# Patient Record
Sex: Male | Born: 1990
Health system: Southern US, Community
[De-identification: ages and names within clinical notes are randomized; demographics above are authoritative.]

## PROBLEM LIST (undated history)

## (undated) ENCOUNTER — Ambulatory Visit (HOSPITAL_COMMUNITY): Admission: EM | Payer: Medicaid Other

## (undated) ENCOUNTER — Emergency Department (HOSPITAL_COMMUNITY): Payer: Medicaid Other | Source: Home / Self Care

## (undated) DIAGNOSIS — T1491XA Suicide attempt, initial encounter: Secondary | ICD-10-CM

## (undated) DIAGNOSIS — F319 Bipolar disorder, unspecified: Secondary | ICD-10-CM

## (undated) DIAGNOSIS — G43909 Migraine, unspecified, not intractable, without status migrainosus: Secondary | ICD-10-CM

## (undated) DIAGNOSIS — F209 Schizophrenia, unspecified: Secondary | ICD-10-CM

## (undated) DIAGNOSIS — G40909 Epilepsy, unspecified, not intractable, without status epilepticus: Secondary | ICD-10-CM

## (undated) DIAGNOSIS — F32A Depression, unspecified: Secondary | ICD-10-CM

## (undated) DIAGNOSIS — F329 Major depressive disorder, single episode, unspecified: Secondary | ICD-10-CM

## (undated) DIAGNOSIS — F419 Anxiety disorder, unspecified: Secondary | ICD-10-CM

## (undated) DIAGNOSIS — K219 Gastro-esophageal reflux disease without esophagitis: Secondary | ICD-10-CM

## (undated) DIAGNOSIS — E559 Vitamin D deficiency, unspecified: Secondary | ICD-10-CM

## (undated) DIAGNOSIS — F1210 Cannabis abuse, uncomplicated: Secondary | ICD-10-CM

## (undated) DIAGNOSIS — F909 Attention-deficit hyperactivity disorder, unspecified type: Secondary | ICD-10-CM

## (undated) DIAGNOSIS — R7303 Prediabetes: Secondary | ICD-10-CM

## (undated) HISTORY — PX: WISDOM TOOTH EXTRACTION: SHX21

## (undated) HISTORY — PX: OTHER SURGICAL HISTORY: SHX169

## (undated) HISTORY — DX: Cannabis abuse, uncomplicated: F12.10

---
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
## 2010-03-06 ENCOUNTER — Emergency Department (HOSPITAL_COMMUNITY): Admission: EM | Admit: 2010-03-06 | Discharge: 2010-03-07 | Payer: Self-pay | Admitting: Emergency Medicine

## 2010-07-07 ENCOUNTER — Ambulatory Visit: Payer: Self-pay | Admitting: Family Medicine

## 2010-08-08 ENCOUNTER — Ambulatory Visit: Payer: Self-pay | Admitting: Family Medicine

## 2010-12-30 ENCOUNTER — Encounter: Payer: Self-pay | Admitting: *Deleted

## 2011-03-05 ENCOUNTER — Ambulatory Visit (INDEPENDENT_AMBULATORY_CARE_PROVIDER_SITE_OTHER): Payer: Medicaid Other

## 2011-03-05 ENCOUNTER — Inpatient Hospital Stay (INDEPENDENT_AMBULATORY_CARE_PROVIDER_SITE_OTHER)
Admission: RE | Admit: 2011-03-05 | Discharge: 2011-03-05 | Disposition: A | Discharge status: Home / Self Care | Payer: Medicaid Other | Source: Ambulatory Visit | Attending: Family Medicine | Admitting: Family Medicine

## 2011-03-05 DIAGNOSIS — S60229A Contusion of unspecified hand, initial encounter: Secondary | ICD-10-CM

## 2011-04-07 NOTE — Op Note (Signed)
[name], [name]               ACCOUNT NO.:  [id number]   MEDICAL RECORD NO.:  [id number]            PATIENT TYPE:   LOCATION:                                 FACILITY:   PHYSICIAN:  Valetta Fuller, M.D.  DATE OF BIRTH:  21-Dec-1990   DATE OF PROCEDURE:  01/09/2006  DATE OF DISCHARGE:                                 OPERATIVE REPORT   PREOPERATIVE DIAGNOSIS:  1.  Meatal stenosis.  2.  Dysuria.   POSTOPERATIVE DIAGNOSIS:  1.  Meatal stenosis.  2.  Dysuria.   PROCEDURE PERFORMED:  Meatal dilation with meatotomy.   SURGEON:  Valetta Fuller, M.D.   ANESTHESIA:  General mask.   INDICATIONS:  Garrett Carrillo is a 20 year old male who presented with some  intermittent, but otherwise fairly chronic dysuria. Clinical exam revealed a  moderate-to-severe meatal stenosis. Urinalysis was unremarkable. We felt  that there was a high likelihood that his meatal stenosis was causing his  dysuria. We recommended consideration for meatotomy; and that was accepted  by the patient's family. They appear to understand the advantages and  disadvantages; and a full informed consent was obtained.   TECHNIQUE AND FINDINGS:  The patient was brought to the operating room where  he had successful induction of general mask anesthesia. He was prepped and  draped in the usual manner. The meatus was gently dilated to 14-French. A  meatotomy was then done in a standard manner utilizing a straight hemostat  at the six o'clock position. An incision was then made. The mucosal edges  did not appear to retract; and, therefore, suturing was not required. At the  completion of procedure, the meatus easily accepted an 18-French dilator.  Lidocaine jelly was instilled. The patient appeared to tolerate the  procedure well.  There were no obvious complications.           ______________________________  Valetta Fuller, M.D.  Electronically Signed     DSG/MEDQ  D:  01/08/2006  T:  01/09/2006  Job:  [id number]

## 2011-04-18 ENCOUNTER — Ambulatory Visit (INDEPENDENT_AMBULATORY_CARE_PROVIDER_SITE_OTHER): Payer: Medicaid Other

## 2011-04-18 ENCOUNTER — Inpatient Hospital Stay (INDEPENDENT_AMBULATORY_CARE_PROVIDER_SITE_OTHER)
Admission: RE | Admit: 2011-04-18 | Discharge: 2011-04-18 | Disposition: A | Discharge status: Home / Self Care | Payer: Medicaid Other | Source: Ambulatory Visit | Attending: Emergency Medicine | Admitting: Emergency Medicine

## 2011-04-18 DIAGNOSIS — IMO0002 Reserved for concepts with insufficient information to code with codable children: Secondary | ICD-10-CM

## 2011-04-18 DIAGNOSIS — H60399 Other infective otitis externa, unspecified ear: Secondary | ICD-10-CM

## 2011-07-18 ENCOUNTER — Encounter: Payer: Self-pay | Admitting: Family Medicine

## 2011-12-17 ENCOUNTER — Encounter (HOSPITAL_COMMUNITY): Payer: Self-pay | Admitting: *Deleted

## 2011-12-17 ENCOUNTER — Emergency Department (INDEPENDENT_AMBULATORY_CARE_PROVIDER_SITE_OTHER): Payer: Medicaid Other

## 2011-12-17 ENCOUNTER — Emergency Department (INDEPENDENT_AMBULATORY_CARE_PROVIDER_SITE_OTHER)
Admission: EM | Admit: 2011-12-17 | Discharge: 2011-12-17 | Disposition: A | Discharge status: Home / Self Care | Payer: Medicaid Other | Source: Home / Self Care | Attending: Emergency Medicine | Admitting: Emergency Medicine

## 2011-12-17 DIAGNOSIS — S60219A Contusion of unspecified wrist, initial encounter: Secondary | ICD-10-CM

## 2011-12-17 DIAGNOSIS — S60211A Contusion of right wrist, initial encounter: Secondary | ICD-10-CM

## 2011-12-17 HISTORY — DX: Attention-deficit hyperactivity disorder, unspecified type: F90.9

## 2011-12-17 MED ORDER — ACETAMINOPHEN-CODEINE #3 300-30 MG PO TABS: 1.0000 | ORAL_TABLET | ORAL | Status: AC | PRN

## 2011-12-17 NOTE — Progress Notes (Signed)
Orthopedic Tech Progress Note Patient Details:  Garrett Carrillo 1991/04/14 [id number]  Type of Splint: Thumb spica Splint Location: right hand Splint Interventions: Application    Nikki Dom 12/17/2011, 7:46 PM

## 2011-12-17 NOTE — ED Notes (Signed)
Per pt slipped on wet floor fell tried to catch self with right hand onset Friday now with pain and swelling right anterior hand

## 2011-12-17 NOTE — ED Provider Notes (Signed)
Chief Complaint  Patient presents with  . Hand Pain  . Hand Injury    History of Present Illness:  The patient fell at school Friday, 3 days ago. He slipped on some grease on some steps and fell on his outstretched right hand. Ever since then he's had pain in the wrist which extends up into the first carpal.  Review of Systems:  Other than noted above, the patient denies any of the following symptoms: Systemic:  No fevers, chills, sweats, or aches.  No fatigue or tiredness. Musculoskeletal:  No joint pain, arthritis, bursitis, swelling, back pain, or neck pain. Neurological:  No muscular weakness, paresthesias, headache, or trouble with speech or coordination.  No dizziness.   PMFSH:  Past medical history, family history, social history, meds, and allergies were reviewed.  Physical Exam:   Vital signs:  BP 120/59  Pulse 61  Temp(Src) 98.8 F (37.1 C) (Oral)  Resp 20  SpO2 100% Gen:  Alert and oriented times 3.  In no distress. Musculoskeletal: No swelling, bruising, or deformity. There is pain to palpation over the wrist, over the anatomical snuffbox area, and over the first metacarpal. All joints have a full range of motion but with pain on movement of the wrist and the thumb Otherwise, all joints had a full a ROM with no swelling, bruising or deformity.  No edema, pulses full. Extremities were warm and pink.  Capillary refill was brisk.  Skin:  Clear, warm and dry.  No rash. Neuro:  Alert and oriented times 3.  Muscle strength was normal.  Sensation was intact to light touch.   Labs:  No results found for this or any previous visit.   Radiology:  Dg Wrist Complete Right  12/17/2011  *RADIOLOGY REPORT*  Clinical Data: Fall on outstretched hand.  RIGHT WRIST - COMPLETE 3+ VIEW  Comparison: 03/05/2011  Findings: No fracture, foreign body, or acute bony findings are identified.  IMPRESSION:  No significant abnormality identified.  Original Report Authenticated By: Dellia Cloud,  M.D.   Dg Hand Complete Right  12/17/2011  *RADIOLOGY REPORT*  Clinical Data: Right hand and wrist pain post fall  RIGHT HAND - COMPLETE 3+ VIEW  Comparison: 03/05/2011  Findings: Bone mineralization normal. Joint spaces preserved. No fracture, dislocation, or bone destruction.  IMPRESSION: No acute abnormalities.  Original Report Authenticated By: Lollie Marrow, M.D.    Medications given in U[facility]:  None  Assessment:   Diagnoses that have been ruled out:  None  Diagnoses that are still under consideration:  None  Final diagnoses:  Contusion of right wrist    Plan:   1.  The following meds were prescribed:   New Prescriptions   ACETAMINOPHEN-CODEINE (TYLENOL #3) 300-30 MG PER TABLET    Take 1-2 tablets by mouth every 4 (four) hours as needed for pain.   2.  The patient was instructed in symptomatic care, including rest and activity, elevation, application of ice and compression.  Appropriate handouts were given. 3.  The patient was told to return if becoming worse in any way, if no better in 3 or 4 days, and given some red flag symptoms that would indicate earlier return.   4.  The patient was told to follow up with Dr. Melvyn Novas extra week. He was placed in a thumb spica splint.   Roque Lias, MD 12/17/11 [phone]

## 2012-08-07 ENCOUNTER — Encounter (HOSPITAL_COMMUNITY): Payer: Self-pay | Admitting: *Deleted

## 2012-08-07 ENCOUNTER — Emergency Department (HOSPITAL_COMMUNITY): Payer: Medicaid Other

## 2012-08-07 ENCOUNTER — Emergency Department (HOSPITAL_COMMUNITY)
Admission: EM | Admit: 2012-08-07 | Discharge: 2012-08-07 | Disposition: A | Discharge status: Home / Self Care | Payer: Medicaid Other | Attending: Emergency Medicine | Admitting: Emergency Medicine

## 2012-08-07 DIAGNOSIS — Y9239 Other specified sports and athletic area as the place of occurrence of the external cause: Secondary | ICD-10-CM | POA: Insufficient documentation

## 2012-08-07 DIAGNOSIS — X500XXA Overexertion from strenuous movement or load, initial encounter: Secondary | ICD-10-CM | POA: Insufficient documentation

## 2012-08-07 DIAGNOSIS — F17200 Nicotine dependence, unspecified, uncomplicated: Secondary | ICD-10-CM | POA: Insufficient documentation

## 2012-08-07 DIAGNOSIS — Z882 Allergy status to sulfonamides status: Secondary | ICD-10-CM | POA: Insufficient documentation

## 2012-08-07 DIAGNOSIS — Y92838 Other recreation area as the place of occurrence of the external cause: Secondary | ICD-10-CM | POA: Insufficient documentation

## 2012-08-07 DIAGNOSIS — Z880 Allergy status to penicillin: Secondary | ICD-10-CM | POA: Insufficient documentation

## 2012-08-07 DIAGNOSIS — M25569 Pain in unspecified knee: Secondary | ICD-10-CM | POA: Insufficient documentation

## 2012-08-07 DIAGNOSIS — S8990XA Unspecified injury of unspecified lower leg, initial encounter: Secondary | ICD-10-CM

## 2012-08-07 MED ORDER — IBUPROFEN 800 MG PO TABS: 800.0000 mg | ORAL_TABLET | Freq: Three times a day (TID) | ORAL | Status: DC

## 2012-08-07 MED ORDER — IBUPROFEN 800 MG PO TABS
800.0000 mg | ORAL_TABLET | Freq: Once | ORAL | Status: AC
  Administered 2012-08-07: 800 mg via ORAL
  Filled 2012-08-07: qty 1

## 2012-08-07 NOTE — ED Notes (Signed)
Pt was in gym class doing an exercise when he heard his right knee pop. Pt c/o pain with weight bearing. No obvious deformity noted. Pt arrived by EMS.

## 2012-08-07 NOTE — ED Provider Notes (Signed)
History     CSN: [id number]  Arrival date & time 08/07/12  1134   None     Chief Complaint  Patient presents with  . Knee Pain    (Consider location/radiation/quality/duration/timing/severity/associated sxs/prior treatment) Patient is a 21 y.o. male presenting with knee pain. The history is provided by the patient ( police).  Knee Pain This is a new problem. The current episode started today.  21yo male with L knee pain after doing a lundge squat at the [facility] today.  Unable to bear weight.  No deformity noted.  Good CMS to extremity.    Past Medical History  Diagnosis Date  . ADHD (attention deficit hyperactivity disorder)     Past Surgical History  Procedure Date  . Thumb surgery     Family History  Problem Relation Age of Onset  . Hypertension Mother   . Hypertension Maternal Grandmother   . Hypertension Maternal Grandfather   . Hypertension Paternal Grandmother   . Hypertension Paternal Grandfather     History  Substance Use Topics  . Smoking status: Current Every Day Smoker    Types: Cigarettes  . Smokeless tobacco: Not on file  . Alcohol Use: Yes      Review of Systems  Constitutional: Negative.   HENT: Negative.   Eyes: Negative.   Respiratory: Negative.   Cardiovascular: Negative.   Gastrointestinal: Negative.   Musculoskeletal:       L knee pain  Neurological: Negative.   Psychiatric/Behavioral: Negative.   All other systems reviewed and are negative.    Allergies  Penicillins and Sulfur  Home Medications  No current outpatient prescriptions on file.  BP 109/60  Pulse 80  Temp 98.3 F (36.8 C) (Oral)  Resp 18  SpO2 98%  Physical Exam  Nursing note and vitals reviewed. Constitutional: He is oriented to person, place, and time. He appears well-developed and well-nourished.  HENT:  Head: Normocephalic.  Eyes: Conjunctivae normal and EOM are normal. Pupils are equal, round, and reactive to light.  Neck: Normal range of motion. Neck  supple.  Cardiovascular: Normal rate.   Pulmonary/Chest: Effort normal.  Abdominal: Soft.  Musculoskeletal: Normal range of motion.       L knee tenderness with external rotation of L foot.  +cms to extremity.    Neurological: He is alert and oriented to person, place, and time.  Skin: Skin is warm and dry.  Psychiatric: He has a normal mood and affect.    ED Course  Procedures (including critical care time)  Labs Reviewed - No data to display Dg Knee Complete 4 Views Right  08/07/2012  *RADIOLOGY REPORT*  Clinical Data: Pain and swelling  RIGHT KNEE - COMPLETE 4+ VIEW  Comparison: None.  Findings: Frontal, lateral, and bilateral oblique views were obtained.  No fracture, dislocation, or effusion.  Joint spaces appear intact.  No erosive change.  IMPRESSION: No abnormality noted.   Original Report Authenticated By: Arvin Collard. WOODRUFF III, M.D.      No diagnosis found.    MDM  Left knee pain after doing a longitudinal bile today. Immobilizer and crutches. Followup with or said. Ice and elevate x24 hours. Ibuprofen for pain.        Remi Haggard, NP 08/07/12 1340

## 2012-08-07 NOTE — ED Notes (Signed)
Patient transported to X-ray 

## 2012-08-10 NOTE — ED Provider Notes (Signed)
Medical screening examination/treatment/procedure(s) were performed by non-physician practitioner and as supervising physician I was immediately available for consultation/collaboration.  Flint Melter, MD 08/10/12 1316

## 2013-06-20 ENCOUNTER — Encounter (HOSPITAL_COMMUNITY): Payer: Self-pay | Admitting: Emergency Medicine

## 2013-06-20 ENCOUNTER — Emergency Department (HOSPITAL_COMMUNITY)
Admission: EM | Admit: 2013-06-20 | Discharge: 2013-06-20 | Disposition: A | Discharge status: Home / Self Care | Payer: Medicaid Other | Attending: Emergency Medicine | Admitting: Emergency Medicine

## 2013-06-20 ENCOUNTER — Emergency Department (HOSPITAL_COMMUNITY): Payer: Medicaid Other

## 2013-06-20 DIAGNOSIS — Y929 Unspecified place or not applicable: Secondary | ICD-10-CM | POA: Insufficient documentation

## 2013-06-20 DIAGNOSIS — S93402A Sprain of unspecified ligament of left ankle, initial encounter: Secondary | ICD-10-CM

## 2013-06-20 DIAGNOSIS — X500XXA Overexertion from strenuous movement or load, initial encounter: Secondary | ICD-10-CM | POA: Insufficient documentation

## 2013-06-20 DIAGNOSIS — M25473 Effusion, unspecified ankle: Secondary | ICD-10-CM | POA: Insufficient documentation

## 2013-06-20 DIAGNOSIS — M25476 Effusion, unspecified foot: Secondary | ICD-10-CM | POA: Insufficient documentation

## 2013-06-20 DIAGNOSIS — Z880 Allergy status to penicillin: Secondary | ICD-10-CM | POA: Insufficient documentation

## 2013-06-20 DIAGNOSIS — IMO0002 Reserved for concepts with insufficient information to code with codable children: Secondary | ICD-10-CM | POA: Insufficient documentation

## 2013-06-20 DIAGNOSIS — Y939 Activity, unspecified: Secondary | ICD-10-CM | POA: Insufficient documentation

## 2013-06-20 DIAGNOSIS — Z8659 Personal history of other mental and behavioral disorders: Secondary | ICD-10-CM | POA: Insufficient documentation

## 2013-06-20 DIAGNOSIS — F17200 Nicotine dependence, unspecified, uncomplicated: Secondary | ICD-10-CM | POA: Insufficient documentation

## 2013-06-20 MED ORDER — NAPROXEN 500 MG PO TABS: 500.0000 mg | ORAL_TABLET | Freq: Two times a day (BID) | ORAL | Status: DC

## 2013-06-20 NOTE — ED Provider Notes (Signed)
Medical screening examination/treatment/procedure(s) were performed by non-physician practitioner and as supervising physician I was immediately available for consultation/collaboration.   Jamieon Lannen M Marcie Shearon, MD 06/20/13 1837 

## 2013-06-20 NOTE — ED Provider Notes (Signed)
  CSN: [id number]     Arrival date & time 06/20/13  1012 History     First MD Initiated Contact with Patient 06/20/13 1016     Chief Complaint  Patient presents with  . Ankle Pain   (Consider location/radiation/quality/duration/timing/severity/associated sxs/prior Treatment) HPI Comments: Patient presenting with left ankle pain.  He reports that yesterday he slipped and twisted his left ankle.  He has been having pain and swelling of the left lateral malleolus since that time.  Patient has been applying ice to the area, but does not feel that it has helped.  He has not taken anything for pain.  He has been ambulatory since the injury, but reports that he has increased pain with bearing weight.    Patient is a 22 y.o. male presenting with ankle pain. The history is provided by the patient.  Ankle Pain Prior injury to area:  No Associated symptoms: no fever, no numbness and no tingling     Past Medical History  Diagnosis Date  . ADHD (attention deficit hyperactivity disorder)    Past Surgical History  Procedure Laterality Date  . Thumb surgery     Family History  Problem Relation Age of Onset  . Hypertension Mother   . Hypertension Maternal Grandmother   . Hypertension Maternal Grandfather   . Hypertension Paternal Grandmother   . Hypertension Paternal Grandfather    History  Substance Use Topics  . Smoking status: Current Every Day Smoker -- 1.00 packs/day    Types: Cigarettes  . Smokeless tobacco: Not on file  . Alcohol Use: Yes     Comment: a bottle of liquor every weekend    Review of Systems  Constitutional: Negative for fever.  Musculoskeletal:       Left ankle pain    Allergies  Penicillins and Sulfur  Home Medications  No current outpatient prescriptions on file. BP 126/66  Pulse 86  Temp(Src) 98.4 F (36.9 C) (Oral)  Resp 16  Ht 6\' 2"  (1.88 m)  Wt 185 lb (83.915 kg)  BMI 23.74 kg/m2  SpO2 100% Physical Exam  Nursing note and vitals  reviewed. Constitutional: He appears well-developed and well-nourished. No distress.  HENT:  Head: Normocephalic and atraumatic.  Cardiovascular: Normal rate, regular rhythm and normal heart sounds.   Pulmonary/Chest: Effort normal and breath sounds normal.  Musculoskeletal:       Left ankle: He exhibits decreased range of motion and swelling. He exhibits no ecchymosis, no deformity and normal pulse. Tenderness. Lateral malleolus tenderness found.  Neurological: He is alert. No sensory deficit.  Skin: Skin is warm and dry. He is not diaphoretic. No erythema.  Psychiatric: He has a normal mood and affect.    ED Course   Procedures (including critical care time)  Labs Reviewed - No data to display Dg Ankle Complete Left  06/20/2013   *RADIOLOGY REPORT*  Clinical Data: Ankle pain and swelling  LEFT ANKLE COMPLETE - 3+ VIEW  Comparison: None.  Findings: Three-view exam left ankle shows no fracture.  No subluxation or dislocation.  Ankle mortise is preserved.  No worrisome lytic or sclerotic osseous abnormality.  IMPRESSION: No acute bony findings.   Original Report Authenticated By: Kennith Center, M.D.   No diagnosis found.  MDM  Patient presenting with left ankle pain after twisting his left ankle yesterday.  Xray negative.  Patient is neurovascularly intact.  Patient given Ankle ASO and crutches.  Pascal Lux [place], PA-C 06/20/13 1136

## 2013-06-20 NOTE — Progress Notes (Signed)
Orthopedic Tech Progress Note Patient Details:  Garrett Carrillo [birth year] [id number]  Ortho Devices Type of Ortho Device: Crutches;ASO Ortho Device/Splint Interventions: Application   Cammer, Mickie Bail 06/20/2013, 12:46 PM

## 2013-06-20 NOTE — ED Notes (Signed)
Patient states he slipped on a step at work and heard something "pop".  Patient presents with swollen L ankle and 10/10 pain.

## 2014-01-25 ENCOUNTER — Emergency Department (HOSPITAL_COMMUNITY)
Admission: EM | Admit: 2014-01-25 | Discharge: 2014-01-25 | Disposition: A | Discharge status: Home / Self Care | Payer: Medicaid Other | Source: Home / Self Care | Attending: Emergency Medicine | Admitting: Emergency Medicine

## 2014-01-25 ENCOUNTER — Emergency Department (INDEPENDENT_AMBULATORY_CARE_PROVIDER_SITE_OTHER): Payer: Medicaid Other

## 2014-01-25 ENCOUNTER — Encounter (HOSPITAL_COMMUNITY): Payer: Self-pay | Admitting: Emergency Medicine

## 2014-01-25 DIAGNOSIS — R229 Localized swelling, mass and lump, unspecified: Secondary | ICD-10-CM

## 2014-01-25 MED ORDER — NAPROXEN 500 MG PO TABS: 500.0000 mg | ORAL_TABLET | Freq: Two times a day (BID) | ORAL | Status: DC

## 2014-01-25 NOTE — Discharge Instructions (Signed)

## 2014-01-25 NOTE — ED Provider Notes (Signed)
Medical screening examination/treatment/procedure(s) were performed by non-physician practitioner and as supervising physician I was immediately available for consultation/collaboration.  Monet North, M.D.  Nikolai Wilczak C Tandrea Kommer, MD 01/25/14 2111 
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
## 2014-03-11 ENCOUNTER — Encounter: Payer: Self-pay | Admitting: Medical

## 2014-03-18 ENCOUNTER — Emergency Department (INDEPENDENT_AMBULATORY_CARE_PROVIDER_SITE_OTHER): Payer: Medicaid Other

## 2014-03-18 ENCOUNTER — Emergency Department (INDEPENDENT_AMBULATORY_CARE_PROVIDER_SITE_OTHER)
Admission: EM | Admit: 2014-03-18 | Discharge: 2014-03-18 | Disposition: A | Discharge status: Home / Self Care | Payer: Medicaid Other | Source: Home / Self Care | Attending: Family Medicine | Admitting: Family Medicine

## 2014-03-18 ENCOUNTER — Encounter (HOSPITAL_COMMUNITY): Payer: Self-pay | Admitting: Emergency Medicine

## 2014-03-18 DIAGNOSIS — S60229A Contusion of unspecified hand, initial encounter: Secondary | ICD-10-CM

## 2014-03-18 DIAGNOSIS — S60221A Contusion of right hand, initial encounter: Secondary | ICD-10-CM | POA: Diagnosis present

## 2014-03-18 MED ORDER — TRAMADOL HCL 50 MG PO TABS: 50.0000 mg | ORAL_TABLET | Freq: Four times a day (QID) | ORAL | Status: DC | PRN

## 2014-03-18 MED ORDER — HYDROCODONE-ACETAMINOPHEN 5-325 MG PO TABS
ORAL_TABLET | ORAL | Status: AC
  Filled 2014-03-18: qty 2

## 2014-03-18 MED ORDER — IBUPROFEN 600 MG PO TABS: 600.0000 mg | ORAL_TABLET | Freq: Four times a day (QID) | ORAL | Status: DC | PRN

## 2014-03-18 MED ORDER — HYDROCODONE-ACETAMINOPHEN 5-325 MG PO TABS
2.0000 | ORAL_TABLET | Freq: Once | ORAL | Status: AC
  Administered 2014-03-18: 2 via ORAL

## 2014-03-18 NOTE — ED Notes (Signed)
States he punched a brick wall last night. C/o pain over MCP joint of middle finger R hand. Joint appears swollen. Has fair ROM.  Has been wearing an ice bandage. Took Ibuprofen 600 mg. this AM with a little relief. Also appears swollen over MCP joint of index finger but denies pain there.

## 2014-03-18 NOTE — ED Provider Notes (Signed)
CSN: [id number]     Arrival date & time 03/18/14  1631 History   First MD Initiated Contact with Patient 03/18/14 1720     Chief Complaint  Patient presents with  . Hand Pain   (Consider location/radiation/quality/duration/timing/severity/associated sxs/prior Treatment) HPI  Hand Pain - one-day duration; located in the second and third MCP joints; occurred suddenly after he punched a brick wall out of frustration last night; since that time has had swelling and pain; tried ibuprofen for relief which worked temporarily; patient has is numbness of his fingers and palm of his hand but normal range of motion; tried an Ace bandage without relief today and was sent here from work for further evaluation  Past Medical History  Diagnosis Date  . ADHD (attention deficit hyperactivity disorder)    Past Surgical History  Procedure Laterality Date  . Thumb surgery Right 2009  . Eardrum repair Right    Family History  Problem Relation Age of Onset  . Adopted: Yes  . Hypertension Mother   . Hypertension Maternal Grandmother   . Hypertension Maternal Grandfather   . Hypertension Paternal Grandmother   . Hypertension Paternal Grandfather    History  Substance Use Topics  . Smoking status: Current Every Day Smoker -- 1.00 packs/day    Types: Cigarettes  . Smokeless tobacco: Not on file  . Alcohol Use: Yes     Comment: occasiona.    Review of Systems  Allergies  Penicillins and Sulfur  Home Medications   Prior to Admission medications   Medication Sig Start Date End Date Taking? Authorizing Provider  ibuprofen (ADVIL,MOTRIN) 600 MG tablet Take 600 mg by mouth every 6 (six) hours as needed for moderate pain.   Yes Historical Provider, MD  naproxen (NAPROSYN) 500 MG tablet Take 1 tablet (500 mg total) by mouth 2 (two) times daily. 06/20/13   Heather Laisure, PA-C  naproxen (NAPROSYN) 500 MG tablet Take 1 tablet (500 mg total) by mouth 2 (two) times daily. 01/25/14   Cathlyn ParsonsAngela M Kabbe, NP   BP  115/59  Pulse 87  Temp(Src) 97.5 F (36.4 C) (Oral)  Resp 16  SpO2 98% Physical Exam Gen: well-appearing young man, alert oriented, pleasant MSK: generalized edema of the dorsum of the right hand with morbid point tenderness over second and third MCP joints normal range of motion at MCP and PIP joints, subjective loss of sensation of the palmar surface CV: 2+ radial pulses with brisk cap refill of the right arm ED Course  Procedures (including critical care time) Labs Review Labs Reviewed - No data to display  Imaging Review Dg Hand Complete Right  03/18/2014   CLINICAL DATA:  Punched a brick wall last night, pain at second and third MCP joints  EXAM: RIGHT HAND - COMPLETE 3+ VIEW  COMPARISON:  01/25/2014  FINDINGS: Osseous mineralization normal.  Joint spaces preserved.  No fracture, dislocation, or bone destruction.  IMPRESSION: Normal exam.   Electronically Signed   By: Ulyses SouthwardMark  Boles M.D.   On: 03/18/2014 18:10     MDM   1. Contusion of right hand    Pt to treat conservatively.     Garnetta BuddyEdward V Ashayla Subia, MD 03/18/14 [phone]

## 2014-03-18 NOTE — Discharge Instructions (Signed)
You have a contusion of the knuckles. Keep ice on it and use tramadol and ibuprofen as needed.   Sincerely,   Dr. Clinton SawyerWilliamson

## 2014-03-18 NOTE — ED Provider Notes (Signed)
Medical screening examination/treatment/procedure(s) were performed by a resident physician or non-physician practitioner and as the supervising physician I was immediately available for consultation/collaboration.  Evan Corey, MD    Evan S Corey, MD 03/18/14 2134 

## 2014-05-07 ENCOUNTER — Emergency Department (INDEPENDENT_AMBULATORY_CARE_PROVIDER_SITE_OTHER): Payer: Medicaid Other

## 2014-05-07 ENCOUNTER — Encounter (HOSPITAL_COMMUNITY): Payer: Self-pay | Admitting: Emergency Medicine

## 2014-05-07 ENCOUNTER — Emergency Department (INDEPENDENT_AMBULATORY_CARE_PROVIDER_SITE_OTHER)
Admission: EM | Admit: 2014-05-07 | Discharge: 2014-05-07 | Disposition: A | Discharge status: Home / Self Care | Payer: Medicaid Other | Source: Home / Self Care | Attending: Emergency Medicine | Admitting: Emergency Medicine

## 2014-05-07 DIAGNOSIS — J209 Acute bronchitis, unspecified: Secondary | ICD-10-CM

## 2014-05-07 LAB — POCT RAPID STREP A: STREPTOCOCCUS, GROUP A SCREEN (DIRECT): NEGATIVE

## 2014-05-07 MED ORDER — AZITHROMYCIN 250 MG PO TABS: ORAL_TABLET | ORAL | Status: DC

## 2014-05-07 MED ORDER — HYDROCODONE-ACETAMINOPHEN 5-325 MG PO TABS: ORAL_TABLET | ORAL | Status: DC

## 2014-05-07 MED ORDER — GUAIFENESIN-CODEINE 100-10 MG/5ML PO SYRP: 5.0000 mL | ORAL_SOLUTION | Freq: Four times a day (QID) | ORAL | Status: DC | PRN

## 2014-05-07 MED ORDER — GUAIFENESIN-CODEINE 100-10 MG/5ML PO SYRP
5.0000 mL | ORAL_SOLUTION | Freq: Four times a day (QID) | ORAL | Status: DC | PRN
Start: 2014-05-07 — End: 2014-11-23

## 2014-05-07 MED ORDER — HYDROCODONE-ACETAMINOPHEN 5-325 MG PO TABS
ORAL_TABLET | ORAL | Status: AC
  Filled 2014-05-07: qty 1

## 2014-05-07 MED ORDER — PREDNISONE 20 MG PO TABS: 20.0000 mg | ORAL_TABLET | Freq: Two times a day (BID) | ORAL | Status: DC

## 2014-05-07 MED ORDER — ALBUTEROL SULFATE HFA 108 (90 BASE) MCG/ACT IN AERS: 1.0000 | INHALATION_SPRAY | Freq: Four times a day (QID) | RESPIRATORY_TRACT | Status: DC | PRN

## 2014-05-07 MED ORDER — HYDROCODONE-ACETAMINOPHEN 5-325 MG PO TABS
2.0000 | ORAL_TABLET | Freq: Once | ORAL | Status: AC
  Administered 2014-05-07: 2 via ORAL

## 2014-05-07 NOTE — ED Notes (Signed)
C/o cough States he is coughing up green mucous States cough is making chest and throat  No meds taking as tx

## 2014-05-07 NOTE — Discharge Instructions (Signed)
Acute Bronchitis Bronchitis is inflammation of the airways that extend from the windpipe into the lungs (bronchi). The inflammation often causes mucus to develop. This leads to a cough, which is the most common symptom of bronchitis.  In acute bronchitis, the condition usually develops suddenly and goes away over time, usually in a couple weeks. Smoking, allergies, and asthma can make bronchitis worse. Repeated episodes of bronchitis may cause further lung problems.  CAUSES Acute bronchitis is most often caused by the same virus that causes a cold. The virus can spread from person to person (contagious).  SIGNS AND SYMPTOMS   Cough.   Fever.   Coughing up mucus.   Body aches.   Chest congestion.   Chills.   Shortness of breath.   Sore throat.  DIAGNOSIS  Acute bronchitis is usually diagnosed through a physical exam. Tests, such as chest X-rays, are sometimes done to rule out other conditions.  TREATMENT  Acute bronchitis usually goes away in a couple weeks. Often times, no medical treatment is necessary. Medicines are sometimes given for relief of fever or cough. Antibiotics are usually not needed but may be prescribed in certain situations. In some cases, an inhaler may be recommended to help reduce shortness of breath and control the cough. A cool mist vaporizer may also be used to help thin bronchial secretions and make it easier to clear the chest.  HOME CARE INSTRUCTIONS  Get plenty of rest.   Drink enough fluids to keep your urine clear or pale yellow (unless you have a medical condition that requires fluid restriction). Increasing fluids may help thin your secretions and will prevent dehydration.   Only take over-the-counter or prescription medicines as directed by your health care provider.   Avoid smoking and secondhand smoke. Exposure to cigarette smoke or irritating chemicals will make bronchitis worse. If you are a smoker, consider using nicotine gum or skin  patches to help control withdrawal symptoms. Quitting smoking will help your lungs heal faster.   Reduce the chances of another bout of acute bronchitis by washing your hands frequently, avoiding people with cold symptoms, and trying not to touch your hands to your mouth, nose, or eyes.   Follow up with your health care provider as directed.  SEEK MEDICAL CARE IF: Your symptoms do not improve after 1 week of treatment.  SEEK IMMEDIATE MEDICAL CARE IF:  You develop an increased fever or chills.   You have chest pain.   You have severe shortness of breath.  You have bloody sputum.   You develop dehydration.  You develop fainting.  You develop repeated vomiting.  You develop a severe headache. MAKE SURE YOU:   Understand these instructions.  Will watch your condition.  Will get help right away if you are not doing well or get worse. Document Released: 12/14/2004 Document Revised: 07/09/2013 Document Reviewed: 04/29/2013 [facility] Patient Information 2015 [employer], [place]. This information is not intended to replace advice given to you by your health care provider. Make sure you discuss any questions you have with your health care provider.   Most upper respiratory infections are caused by viruses and do not require antibiotics.  We try to save the antibiotics for when we really need them to prevent bacteria from developing resistance to them.  Here are a few hints about things that can be done at home to help get over an upper respiratory infection quicker:  Get extra sleep and extra fluids.  Get 7 to 9 hours of sleep per  night and 6 to 8 glasses of water a day.  Getting extra sleep keeps the immune system from getting run down.  Most people with an upper respiratory infection are a little dehydrated.  The extra fluids also keep the secretions liquified and easier to deal with.  Also, get extra vitamin C.  4000 mg per day is the recommended dose. For the aches, headache, and  fever, acetaminophen or ibuprofen are helpful.  These can be alternated every 4 hours.  People with liver disease should avoid large amounts of acetaminophen, and people with ulcer disease, gastroesophageal reflux, gastritis, congestive heart failure, chronic kidney disease, coronary artery disease and the elderly should avoid ibuprofen. For nasal congestion try Mucinex-D, or if you're having lots of sneezing or clear nasal drainage use Zyrtec-D. People with high blood pressure can take these if their blood pressure is controlled, if not, it's best to avoid the forms with a "D" (decongestants).  You can use the plain Mucinex, Allegra, Claritin, or Zyrtec even if your blood pressure is not controlled.   A Saline nasal spray such as Ocean Spray can also help.  You can add a decongestant sprays such as Afrin, but you should not use the decongestant sprays for more than 3 or 4 days since they can be habituating.  Breathe Rite nasal strips can also offer a non-drug alternative treatment to nasal congestion, especially at night. For people with symptoms of sinusitis, sleeping with your head elevated can be helpful.  For sinus pain, moist, hot compresses to the face may provide some relief.  Many people find that inhaling steam as in a shower or from a pot of steaming water can help. For any viral infection, zinc containing lozenges such as Cold-Eze or Zicam are helpful.  Zinc helps to fight viral infection.  Hot salt water gargles (8 oz of hot water, 1/2 tsp of table salt, and a pinch of baking soda) can give relief as well as hot beverages such as hot tea.  Sucrets extra strength lozenges will help the sore throat.  For the cough, take Delsym 2 tsp every 12 hours.  It has also been found recently that Aleve can help control a cough.  The dose is 1 to 2 tablets twice daily with food.  This can be combined with Delsym. (Note, if you are taking ibuprofen, you should not take Aleve as well--take one or the other.) A  cool mist vaporizer will help keep your mucous membranes from drying out.   It's important when you have an upper respiratory infection not to pass the infection to others.  This involves being very careful about the following:  Frequent hand washing or use of hand sanitizer, especially after coughing, sneezing, blowing your nose or touching your face, nose or eyes. Do not shake hands or touch anyone and try to avoid touching surfaces that other people use such as doorknobs, shopping carts, telephones and computer keyboards. Use tissues and dispose of them properly in a garbage can or ziplock bag. Cough into your sleeve. Do not let others eat or drink after you.  It's also important to recognize the signs of serious illness and get evaluated if they occur: Any respiratory infection that lasts more than 7 to 10 days.  Yellow nasal drainage and sputum are not reliable indicators of a bacterial infection, but if they last for more than 1 week, see your doctor. Fever and sore throat can indicate strep. Fever and cough can indicate influenza or pneumonia.  Any kind of severe symptom such as difficulty breathing, intractable vomiting, or severe pain should prompt you to see a doctor as soon as possible.   Your body's immune system is really the thing that will get rid of this infection.  Your immune system is comprised of 2 types of specialized cells called T cells and B cells.  T cells coordinate the array of cells in your body that engulf invading bacteria or viruses while B cells orchestrate the production of antibodies that neutralize infection.  Anything we do or any medications we give you, will just strengthen your immune system or help it clear up the infection quicker.  Here are a few helpful hints to improve your immune system to help overcome this illness or to prevent future infections:  A few vitamins can improve the health of your immune system.  That's why your diet should include plenty  of fruits, vegetables, fish, nuts, and whole grains.  Vitamin A and bet-carotene can increase the cells that fight infections (T cells and B cells).  Vitamin A is abundant in dark greens and orange vegetables such as spinach, greens, sweet potatoes, and carrots.  Vitamin B6 contributes to the maturation of white blood cells, the cells that fight disease.  Foods with vitamin B6 include cold cereal and bananas.  Vitamin C is credited with preventing colds because it increases white blood cells and also prevents cellular damage.  Citrus fruits, peaches and green and red bell peppers are all hight in vitamin C.  Vitamin E is an anti-oxidant that encourages the production of natural killer cells which reject foreign invaders and B cells that produce antibodies.  Foods high in vitamin E include wheat germ, nuts and seeds.  Foods high in omega-3 fatty acids found in foods like salmon, tuna and mackerel boost your immune system and help cells to engulf and absorb germs.  Probiotics are good bacteria that increase your T cells.  These can be found in yogurt and are available in supplements such as Culturelle or Align.  Moderate exercise increases the strength of your immune system and your ability to recover from illness.  I suggest 3 to 5 moderate intensity 30 minute workouts per week.    Sleep is another component of maintaining a strong immune system.  It enables your body to recuperate from the day's activities, stress and work.  My recommendation is to get between 7 and 9 hours of sleep per night.  If you smoke, try to quit completely or at least cut down.  Drink alcohol only in moderation if at all.  No more than 2 drinks daily for men or 1 for women.  Get a flu vaccine early in the fall or if you have not gotten one yet, once this illness has run its course.  If you are over 65, a smoker, or an asthmatic, get a pneumococcal vaccine.  My final recommendation is to maintain a healthy weight.   Excess weight can impair the immune system by interfering with the way the immune system deals with invading viruses or bacteria.

## 2014-05-07 NOTE — ED Provider Notes (Signed)
Chief Complaint   Chief Complaint  Patient presents with  . Cough    History of Present Illness   Garrett Carrillo is a 23 year old male who has had a 2 to three-day history of cough productive green sputum, aching in his chest, nasal congestion, rhinorrhea, sinus pressure, sore throat, temperature up to 101, chills, and dizziness. He denies any wheezing, chest tightness, or GI symptoms. He's been exposed to several family members with similar symptoms.  Review of Systems   Other than as noted above, the patient denies any of the following symptoms: Systemic:  No fevers, chills, sweats, or myalgias. Eye:  No redness or discharge. ENT:  No ear pain, headache, nasal congestion, drainage, sinus pressure, or sore throat. Neck:  No neck pain, stiffness, or swollen glands. Lungs:  No cough, sputum production, hemoptysis, wheezing, chest tightness, shortness of breath or chest pain. GI:  No abdominal pain, nausea, vomiting or diarrhea.  PMFSH   Past medical history, family history, social history, meds, and allergies were reviewed. He is allergic to penicillin and sulfa.  Physical exam   Vital signs:  BP 115/73  Pulse 82  Temp(Src) 101.1 F (38.4 C) (Oral)  Resp 20  SpO2 97% General:  Alert and oriented.  In no distress.  Skin warm and dry. Eye:  No conjunctival injection or drainage. Lids were normal. ENT:  TMs and canals were normal, without erythema or inflammation.  Nasal mucosa was clear and uncongested, without drainage.  Mucous membranes were moist.  Pharynx was clear with no exudate or drainage.  There were no oral ulcerations or lesions. Neck:  Supple, no adenopathy, tenderness or mass. Lungs:  No respiratory distress.  Lungs were clear to auscultation, without wheezes, rales or rhonchi.  Breath sounds were clear and equal bilaterally.  Heart:  Regular rhythm, without gallops, murmers or rubs. Skin:  Clear, warm, and dry, without rash or lesions.  Labs   Results for  orders placed during the hospital encounter of 05/07/14  POCT RAPID STREP A ([facility]ARE ONLY)      Result Value Ref Range   Streptococcus, Group A Screen (Direct) NEGATIVE  NEGATIVE     Radiology   Dg Chest 2 View  05/07/2014   CLINICAL DATA:  Cough, fever  EXAM: CHEST  2 VIEW  COMPARISON:  03/07/2010  FINDINGS: Lungs are clear.  No pleural effusion or pneumothorax.  The heart is normal in size.  Visualized osseous structures are within normal limits.  IMPRESSION: Normal chest radiographs.   Electronically Signed   By: Charline BillsSriyesh  Krishnan M.D.   On: 05/07/2014 13:38   Assessment     The encounter diagnosis was Acute bronchitis, unspecified organism.  Plan    1.  Meds:  The following meds were prescribed:   Discharge Medication List as of 05/07/2014  1:56 PM    START taking these medications   Details  albuterol (PROVENTIL HFA;VENTOLIN HFA) 108 (90 BASE) MCG/ACT inhaler Inhale 1-2 puffs into the lungs every 6 (six) hours as needed for wheezing or shortness of breath., Starting 05/07/2014, Until Discontinued, Normal    azithromycin (ZITHROMAX Z-PAK) 250 MG tablet Take as directed., Normal    guaiFENesin-codeine (GUIATUSS AC) 100-10 MG/5ML syrup Take 5 mLs by mouth 4 (four) times daily as needed for cough., Starting 05/07/2014, Until Discontinued, Print    HYDROcodone-acetaminophen (NORCO/VICODIN) 5-325 MG per tablet 1 to 2 tabs every 4 to 6 hours as needed for pain., Print    predniSONE (DELTASONE) 20 MG  tablet Take 1 tablet (20 mg total) by mouth 2 (two) times daily., Starting 05/07/2014, Until Discontinued, Normal        2.  Patient Education/Counseling:  The patient was given appropriate handouts, self care instructions, and instructed in symptomatic relief.  Instructed to get extra fluids, rest, and use a cool mist vaporizer.    3.  Follow up:  The patient was told to follow up here if no better in 3 to 4 days, or sooner if becoming worse in any way, and given some red flag symptoms  such as increasing fever, difficulty breathing, chest pain, or persistent vomiting which would prompt immediate return.  Follow up here as needed.      Reuben Likesavid C Keller, MD 05/07/14 [phone]

## 2014-05-09 LAB — CULTURE, GROUP A STREP

## 2014-11-09 ENCOUNTER — Encounter (HOSPITAL_COMMUNITY): Payer: Self-pay | Admitting: Emergency Medicine

## 2014-11-09 ENCOUNTER — Emergency Department (HOSPITAL_COMMUNITY)
Admission: EM | Admit: 2014-11-09 | Discharge: 2014-11-09 | Disposition: A | Discharge status: Home / Self Care | Payer: Medicaid Other | Attending: Emergency Medicine | Admitting: Emergency Medicine

## 2014-11-09 DIAGNOSIS — Z8659 Personal history of other mental and behavioral disorders: Secondary | ICD-10-CM | POA: Diagnosis not present

## 2014-11-09 DIAGNOSIS — Z720 Tobacco use: Secondary | ICD-10-CM | POA: Insufficient documentation

## 2014-11-09 DIAGNOSIS — K6289 Other specified diseases of anus and rectum: Secondary | ICD-10-CM | POA: Diagnosis present

## 2014-11-09 DIAGNOSIS — Z7952 Long term (current) use of systemic steroids: Secondary | ICD-10-CM | POA: Insufficient documentation

## 2014-11-09 DIAGNOSIS — K529 Noninfective gastroenteritis and colitis, unspecified: Secondary | ICD-10-CM | POA: Diagnosis not present

## 2014-11-09 DIAGNOSIS — K644 Residual hemorrhoidal skin tags: Secondary | ICD-10-CM | POA: Insufficient documentation

## 2014-11-09 DIAGNOSIS — K602 Anal fissure, unspecified: Secondary | ICD-10-CM | POA: Insufficient documentation

## 2014-11-09 DIAGNOSIS — Z880 Allergy status to penicillin: Secondary | ICD-10-CM | POA: Diagnosis not present

## 2014-11-09 DIAGNOSIS — Z791 Long term (current) use of non-steroidal anti-inflammatories (NSAID): Secondary | ICD-10-CM | POA: Insufficient documentation

## 2014-11-09 DIAGNOSIS — Z792 Long term (current) use of antibiotics: Secondary | ICD-10-CM | POA: Insufficient documentation

## 2014-11-09 MED ORDER — HYDROCODONE-ACETAMINOPHEN 5-325 MG PO TABS
1.0000 | ORAL_TABLET | Freq: Once | ORAL | Status: AC
  Administered 2014-11-09: 1 via ORAL
  Filled 2014-11-09: qty 1

## 2014-11-09 MED ORDER — LIDOCAINE HCL 2 % EX GEL: 1.0000 "application " | Freq: Two times a day (BID) | CUTANEOUS | Status: DC | PRN

## 2014-11-09 MED ORDER — TRAMADOL HCL 50 MG PO TABS: 50.0000 mg | ORAL_TABLET | Freq: Four times a day (QID) | ORAL | Status: DC | PRN

## 2014-11-09 MED ORDER — NITROGLYCERIN 0.4 % RE OINT: 1.0000 "application " | TOPICAL_OINTMENT | Freq: Two times a day (BID) | RECTAL | Status: DC

## 2014-11-09 NOTE — Discharge Instructions (Signed)
Anal Fissure, Adult An anal fissure is a small tear or crack in the skin around the opening of the butt (anus).Bleeding from the tear or crack usually stops on its own within a few minutes. The bleeding may happen every time you poop until the tear or crack heals. HOME CARE  Eat lots of fruit, whole grains, and vegetables. Avoid foods like bananas and dairy products. These foods can make it hard to poop.  Take a warm water bath (sitz bath) as told by your doctor.  Drink enough fluids to keep your pee (urine) clear or pale yellow.  Only take medicines as told by your doctor. Do not take aspirin.  Do not use numbing creams or hydrocortisone cream on the area. These creams can slow healing. GET HELP RIGHT AWAY IF:  Your tear or crack is not healed in 3 days.  You have more bleeding.  You have a fever.  You have watery poop (diarrhea) mixed with blood.  You have pain.  You are getting worse, not better. MAKE SURE YOU:   Understand these instructions.  Will watch your condition.  Will get help right away if you are not doing well or get worse. Document Released: 07/05/2011 Document Revised: 01/29/2012 Document Reviewed: 07/05/2011 [facility] Patient Information 2015 [place], [place]. This information is not intended to replace advice given to you by your health care provider. Make sure you discuss any questions you have with your health care provider.   Emergency Department Resource Guide 1) Find a Doctor and Pay Out of Pocket Although you won't have to find out who is covered by your insurance plan, it is a good idea to ask around and get recommendations. You will then need to call the office and see if the doctor you have chosen will accept you as a new patient and what types of options they offer for patients who are self-pay. Some doctors offer discounts or will set up payment plans for their patients who do not have insurance, but you will need to ask so you aren't surprised  when you get to your appointment.  2) Contact Your Local Health Department Not all health departments have doctors that can see patients for sick visits, but many do, so it is worth a call to see if yours does. If you don't know where your local health department is, you can check in your phone book. The CDC also has a tool to help you locate your state's health department, and many state websites also have listings of all of their local health departments.  3) Find a Walk-in Clinic If your illness is not likely to be very severe or complicated, you may want to try a walk in clinic. These are popping up all over the country in pharmacies, drugstores, and shopping centers. They're usually staffed by nurse practitioners or physician assistants that have been trained to treat common illnesses and complaints. They're usually fairly quick and inexpensive. However, if you have serious medical issues or chronic medical problems, these are probably not your best option.  No Primary Care Doctor: - Call Health Connect at  [phone] - they can help you locate a primary care doctor that  accepts your insurance, provides certain services, etc. - Physician Referral Service- [phone]  Chronic Pain Problems: Organization         Address  Phone   Notes  [facility]  [phone] Patients need to be referred by their primary care doctor.   Medication  Assistance: Organization         Address  Phone   Notes  [facility] Medication Assistance Program [address] [place], [place] [address] [phone] --Must be a resident of [facility] -- Must have NO insurance coverage whatsoever (no Medicaid/ Medicare, etc.) -- The pt. MUST have a primary care doctor that directs their care regularly and follows them in the community   [facility]  [phone]   [employer]  [phone]    Agencies that provide inexpensive medical care: Organization          Address  Phone   Notes  [facility]  [phone]   [facility]    [phone]   [facility] [address] [place], [place] [address] [phone]   [facility] [address], [place] [phone]   [facility]anned [facility]    [phone]   [facility]    [phone]   [facility]  [address], [place] Phone:  [phone], Fax:  [phone] Hours of Operation:  9 am - 6 pm, M-F.  Also accepts Medicaid/Medicare and self-pay.  [facility]  [address], [place] Phone: [phone], Fax: [phone]. Hours of Operation:  8:30 am - 5:30 pm, M-F.  Also accepts Medicaid and self-pay.  [facility] [place] [address] [place] Phone: [phone]   [facility] [address] Natasha Bence [place], [place] [phone], Ext. [phone] Mondays & Thursdays: 7-9 AM.  First 15 patients are seen on a first come, first serve basis.    Medicaid-accepting [facility] Providers:  Organization         Address  Phone   Notes  [facility] [address],  [phone] Also accepts self-pay patients.  [facility] [address] Laurell Josephs [employer], [place]  [phone]   [facility] [address], [place] [phone]   [facility] [address], [place] [phone]   Renaye Rakers [address], [place]   [phone] Only accepts [place] patients after they have their name applied to their card.   Self-Pay (no insurance) in [facility]:  Organization         Address  Phone   Notes  Sickle Cell Patients, [facility] [address] [place], [place] [phone]   [facility] [address] [employer], [place] [phone]    [facility] [place]  [address], [place] [phone]   [facility]/Dr. Osei-Bonsu  [address], [place] or [address], [place] [phone] Phone number for both [place] and [place] locations is the same.  [facility] [address] [place] [phone]   [facility] [address], [place] or [address] [phone]   [facility] [address] [place] [phone], phone; [phone], fax Sees patients 1st and 3rd Saturday of every month.  Must not qualify for public or private insurance (i.e. Medicaid, Medicare, [place] Health Choice, Veterans' Benefits)  Household income should be no more than 200% of the poverty level The clinic  cannot treat you if you are pregnant or think you are pregnant  Sexually transmitted diseases are not treated at the clinic.    Dental Care: Organization         Address  Phone  Notes  [facility] [address], [place] [phone] Accepts children up to age 121 who are enrolled in [place] or [place] Health Choice; pregnant women with a Medicaid card; and children who have applied for Medicaid or [place] Health Choice, but were declined, whose parents can pay a reduced fee at time of service.  [facility] [place]  [address], [place] [phone]) [phone] Accepts children up to age 23 who are enrolled in [place] or [place] Health Choice; pregnant women with a Medicaid card; and children who have applied for Medicaid or [place] Health Choice, but were declined, whose parents can pay a reduced fee at time of service.  [facility]  [address], [place] [phone] Patients are seen by appointment only. Walk-ins are not accepted. [facility] will see patients 4518  years of age and older. Monday - Tuesday (8am-5pm) Most Wednesdays (8:30-5pm) $30 per visit, cash only  [facility]  [address], [facility] [place] [phone] Patients are seen by appointment only. Walk-ins are not accepted. [facility] will see patients 23 years of age and older. One Wednesday Evening (Monthly: Volunteer Based).  $30 per visit, cash only  [facility] [facility]  [phone] for adults; Children under age 684, call Graduate Pediatric Dentistry at [phone]. Children aged 654-14, please call [phone] to request a pediatric application.  Dental services are provided in all areas of dental care including fillings, crowns and bridges, complete and partial dentures, implants, gum treatment, root canals, and extractions. Preventive care is also provided. Treatment is provided to both adults and children. Patients are selected via a lottery and there is often a waiting list.   [facility] [address], [place]  [phone] [email or web address]   [facility] [address], [place], [place] [phone], Ext. [phone] Second and Fourth Thursday of each month, opens at 6:30 AM; Clinic ends at 9 AM.  Patients are seen on a first-come first-served basis, and a limited number are seen during each clinic.   [facility]  [address], [place], [place] [phone]   Eligibility Requirements You must have lived in [place], [place], or [place] counties for at least the last three months.   You cannot be eligible for state or federal sponsored [place]healthcare insurance, including CIGNAVeterans Administration, [place]icaid, or [employer]icare.   You generally cannot be eligible for healthcare insurance through your employer.    How to apply: Eligibility screenings are held every Tuesday and Wednesday afternoon from 1:00 pm until 4:00 pm. You do not need an appointment for the interview!  [facility]  [address], [place], [place] [phone]   [facility]  [phone]   [facility]  [phone]   [facility]  [phone]    Behavioral Health Resources in the Community: Intensive Outpatient Programs Organization         Address  Phone  Notes  [facility] [address], [place], [place] [phone]   [facility] [address], [place], [place]  [place] [phone]   °[facility] [address], [place], [place] ° [phone]   °[facility] [address],  °[place], [place] 1-800-853-5163 or 336-641-4981   °Substance Abuse Resources °Organization         Address  Phone  Notes  °[facility]  336-882-2125   °[facility]  336-784-9470   °[facility]  336-285-9073   °[facility]  336-845-3988   °Residential & Outpatient Substance Abuse Program  1-800-659-3381   °Psychological Services °Organization         Address  Phone  Notes  °[facility]  336- [phone]   °[facility]  336- 378-7881   °[facility] [address], [place] 1-800-853-5163 or 336-641-4981   ° °Mobile Crisis Teams °Organization         Address  Phone  Notes  °[facility], Mobile Crisis Care Unit  1-877-626-1772   °Assertive °Psychotherapeutic Services ° [address] [place], [place] 336-834-9664   °Sharon DeEsch [address] °[place] 336-554-5454   ° °Self-Help/Support Groups °Organization         Address  Phone             Notes  °[facility] [place] - variety of support groups  336- 373-1402 Call for more information  °Narcotics Anonymous (NA), [facility] [address], °[place]  2 meetings at this location  ° °Residential Treatment Programs °Organization         Address  Phone  Notes  °[facility] [address],    °[place]   1-866-801-8205   °[facility] ° [address], [place], [place] [phone]   °[facility] [address], [place] [phone] Admissions: 8am-3pm M-F  °[facility] [address]    °[place], [place] 336-841-1104   °[facility] [address], [place], [place] [phone]   °[facility] [address]  °[place], [place] 336-285-9073   °[facility] - Intensive Outpatient [address], [place], [place] 336-852-3033   °[facility] ([facility] [address]  °[place], [place] 1-877-615-2722 or 336-784-9470   °[facility] ([facility]) [address] [place], [place] 336-227-7417 Accepts Medicaid  °[facility] [address]  °[place] 1-800-659-3381 Substance Abuse/Addiction Treatment  ° °[place] [facility] Resources °Organization         Address  Phone  Notes  °[facility]  (888) 581-9988   °Julie Brannon, PhD [address] [place], [place]   (336) [phone] or (336) 951-0000   °[facility]   [address] °[place], [place] (336) 349-4454   °[facility] [address], [place], [place] (336) 342-8316 Insurance/Medicaid/sponsorship through [facility]  °[facility] [address]                                    [place], [place] [phone]) [phone] Therapy/tele-psych/case  °[facility] [address]  ° [place], [place] (336) [phone]    °Dr. Arfeen  (336) [phone]   °[facility] [place]  [employer] [place] [facility] 1) [address],  °2) [address], [place] °3)  [address], [place] (336) 349-3220 °(336) 342-7768 ° °(  [phone]) [phone]   [facility] Child [facility] Hotline (210)690-0457(336) (562)181-8416 or [phone]) [phone] (After Hours)

## 2014-11-09 NOTE — ED Notes (Signed)
Pt. Stated, rectal pain with bloody stool since last night

## 2014-11-09 NOTE — ED Provider Notes (Signed)
CSN: [id number]     Arrival date & time 11/09/14  4098 History   First MD Initiated Contact with Patient 11/09/14 [phone]     Chief Complaint  Patient presents with  . Rectal Pain     (Consider location/radiation/quality/duration/timing/severity/associated sxs/prior Treatment) Patient is a 23 y.o. male presenting with hematochezia.  Rectal Bleeding Quality: bright red specks last night, dark red on tissue paper today. Amount:  Scant Duration:  2 days Timing:  Constant Progression:  Worsening Chronicity:  New Context: defecation, diarrhea and rectal pain   Context: not anal penetration (denies), not foreign body and not rectal injury   Pain details:    Quality:  Sharp   Severity:  Severe   Duration:  2 days   Timing:  Constant   Progression:  Worsening Similar prior episodes: no   Relieved by:  Nothing Worsened by:  Nothing tried Ineffective treatments:  None tried Associated symptoms: abdominal pain (llq radiates from rectum) and recent illness   Associated symptoms: no fever, no light-headedness, no loss of consciousness and no vomiting   Risk factors: no hx of IBD     Past Medical History  Diagnosis Date  . ADHD (attention deficit hyperactivity disorder)    Past Surgical History  Procedure Laterality Date  . Thumb surgery Right 2009  . Eardrum repair Right    Family History  Problem Relation Age of Onset  . Adopted: Yes  . Hypertension Mother   . Hypertension Maternal Grandmother   . Hypertension Maternal Grandfather   . Hypertension Paternal Grandmother   . Hypertension Paternal Grandfather    History  Substance Use Topics  . Smoking status: Current Every Day Smoker -- 1.00 packs/day    Types: Cigarettes  . Smokeless tobacco: Not on file  . Alcohol Use: Yes     Comment: occasiona.    Review of Systems  Constitutional: Negative for fever.  HENT: Negative for sore throat.   Eyes: Negative for visual disturbance.  Respiratory: Negative for shortness of  breath.   Cardiovascular: Negative for chest pain.  Gastrointestinal: Positive for abdominal pain (llq radiates from rectum), diarrhea, blood in stool, hematochezia, anal bleeding and rectal pain. Negative for nausea, vomiting and abdominal distention.  Genitourinary: Negative for difficulty urinating.  Musculoskeletal: Negative for back pain and neck stiffness.  Skin: Negative for rash.  Neurological: Negative for loss of consciousness, syncope, light-headedness and headaches.      Allergies  Penicillins and Sulfur  Home Medications   Prior to Admission medications   Medication Sig Start Date End Date Taking? Authorizing Provider  albuterol (PROVENTIL HFA;VENTOLIN HFA) 108 (90 BASE) MCG/ACT inhaler Inhale 1-2 puffs into the lungs every 6 (six) hours as needed for wheezing or shortness of breath. Patient not taking: Reported on 11/09/2014 05/07/14   Reuben Likes, MD  azithromycin (ZITHROMAX Z-PAK) 250 MG tablet Take as directed. Patient not taking: Reported on 11/09/2014 05/07/14   Reuben Likes, MD  guaiFENesin-codeine [facility]) 100-10 MG/5ML syrup Take 5 mLs by mouth 4 (four) times daily as needed for cough. Patient not taking: Reported on 11/09/2014 05/07/14   Reuben Likes, MD  guaiFENesin-codeine [facility]) 100-10 MG/5ML syrup Take 5 mLs by mouth 4 (four) times daily as needed for cough. Patient not taking: Reported on 11/09/2014 05/07/14   Reuben Likes, MD  HYDROcodone-acetaminophen (NORCO/VICODIN) 5-325 MG per tablet 1 to 2 tabs every 4 to 6 hours as needed for pain. Patient not taking: Reported on 11/09/2014 05/07/14  Reuben Likesavid C Keller, MD  HYDROcodone-acetaminophen (NORCO/VICODIN) 5-325 MG per tablet 1 to 2 tabs every 4 to 6 hours as needed for pain. Patient not taking: Reported on 11/09/2014 05/07/14   Reuben Likesavid C Keller, MD  ibuprofen (ADVIL,MOTRIN) 600 MG tablet Take 1 tablet (600 mg total) by mouth every 6 (six) hours as needed. Patient not taking: Reported on 11/09/2014  03/18/14   Garnetta BuddyEdward Williamson V, MD  naproxen (NAPROSYN) 500 MG tablet Take 1 tablet (500 mg total) by mouth 2 (two) times daily. Patient not taking: Reported on 11/09/2014 06/20/13   Santiago GladHeather Laisure, PA-C  naproxen (NAPROSYN) 500 MG tablet Take 1 tablet (500 mg total) by mouth 2 (two) times daily. Patient not taking: Reported on 11/09/2014 01/25/14   Cathlyn ParsonsAngela M Kabbe, NP  predniSONE (DELTASONE) 20 MG tablet Take 1 tablet (20 mg total) by mouth 2 (two) times daily. Patient not taking: Reported on 11/09/2014 05/07/14   Reuben Likesavid C Keller, MD  traMADol (ULTRAM) 50 MG tablet Take 1 tablet (50 mg total) by mouth every 6 (six) hours as needed. Patient not taking: Reported on 11/09/2014 03/18/14   Garnetta BuddyEdward Williamson V, MD   BP 102/58 mmHg  Pulse 54  Temp(Src) 97.7 F (36.5 C) (Oral)  Resp 17  Ht 6\' 2"  (1.88 m)  Wt 203 lb (92.08 kg)  BMI 26.05 kg/m2  SpO2 99% Physical Exam  Constitutional: He is oriented to person, place, and time. He appears well-developed and well-nourished. No distress.  HENT:  Head: Normocephalic and atraumatic.  Eyes: Conjunctivae and EOM are normal.  Neck: Normal range of motion.  Cardiovascular: Normal rate, regular rhythm, normal heart sounds and intact distal pulses.  Exam reveals no gallop and no friction rub.   No murmur heard. Pulmonary/Chest: Effort normal and breath sounds normal. No respiratory distress. He has no wheezes. He has no rales.  Abdominal: Soft. He exhibits no distension. There is no tenderness. There is no guarding.  Genitourinary: Rectal exam shows external hemorrhoid (very small, non thrombosed) and tenderness. Rectal exam shows no mass and anal tone normal. Prostate is not tender.  Musculoskeletal: He exhibits no edema.  Neurological: He is alert and oriented to person, place, and time.  Skin: Skin is warm and dry. He is not diaphoretic.  Nursing note and vitals reviewed.   ED Course  Procedures (including critical care time) Labs Review Labs Reviewed  - No data to display  Imaging Review No results found.   EKG Interpretation None      MDM   Final diagnoses:  None    23 year old male with no significant medical history presents with concern of loose stool and rectal pain for 2 days.  Patient describes blood on tissue paper however does not describe large amounts of blood, does not have any signs of hypovolemia/anemia on exam, and have low suspicion for a clinically significant gastrointestinal bleed.  His abdominal exam is overall benign and have low suspicion for acute intra-abdominal pathology including low suspicion for appendicitis, diverticulitis, cholecystitis, pancreatitis.  His rectal exam is significant for a very small external hemorrhoid without thrombosis, and concern for anal fissure with significant tenderness on rectal exam. Given his severe rectal pain which is worse with exam and bowel movements, no significant tender external hemorrhoid on exam have high suspicion for anal fissure.  Will treat patient with topical nitroglycerin and lidocaine jelly for symptom relief and recommend close follow-up with a primary care physician.  Discussed side effects and symptoms to watch for.  Patient also  with likely viral gastroenteritis causing diarrhea.   Patient discharged in stable condition with understanding of reasons to return.     Rhae LernerErin Elizabeth Meckenzie Balsley, MD 11/09/14 1821  Suzi RootsKevin E Steinl, MD 11/16/14 [phone]

## 2014-11-22 ENCOUNTER — Encounter (HOSPITAL_COMMUNITY): Payer: Self-pay | Admitting: Oncology

## 2014-11-22 ENCOUNTER — Emergency Department (HOSPITAL_COMMUNITY): Payer: Medicaid Other

## 2014-11-22 ENCOUNTER — Emergency Department (HOSPITAL_COMMUNITY)
Admission: EM | Admit: 2014-11-22 | Discharge: 2014-11-23 | Disposition: A | Discharge status: Home / Self Care | Payer: Medicaid Other | Attending: Emergency Medicine | Admitting: Emergency Medicine

## 2014-11-22 DIAGNOSIS — W228XXA Striking against or struck by other objects, initial encounter: Secondary | ICD-10-CM | POA: Insufficient documentation

## 2014-11-22 DIAGNOSIS — Z7952 Long term (current) use of systemic steroids: Secondary | ICD-10-CM | POA: Diagnosis not present

## 2014-11-22 DIAGNOSIS — Z720 Tobacco use: Secondary | ICD-10-CM | POA: Diagnosis not present

## 2014-11-22 DIAGNOSIS — S0081XA Abrasion of other part of head, initial encounter: Secondary | ICD-10-CM | POA: Insufficient documentation

## 2014-11-22 DIAGNOSIS — F1210 Cannabis abuse, uncomplicated: Secondary | ICD-10-CM | POA: Diagnosis not present

## 2014-11-22 DIAGNOSIS — Z79899 Other long term (current) drug therapy: Secondary | ICD-10-CM | POA: Insufficient documentation

## 2014-11-22 DIAGNOSIS — F919 Conduct disorder, unspecified: Secondary | ICD-10-CM | POA: Diagnosis present

## 2014-11-22 DIAGNOSIS — R55 Syncope and collapse: Secondary | ICD-10-CM | POA: Diagnosis not present

## 2014-11-22 DIAGNOSIS — R45851 Suicidal ideations: Secondary | ICD-10-CM

## 2014-11-22 DIAGNOSIS — Y998 Other external cause status: Secondary | ICD-10-CM | POA: Diagnosis not present

## 2014-11-22 DIAGNOSIS — F329 Major depressive disorder, single episode, unspecified: Secondary | ICD-10-CM | POA: Insufficient documentation

## 2014-11-22 DIAGNOSIS — Z880 Allergy status to penicillin: Secondary | ICD-10-CM | POA: Diagnosis not present

## 2014-11-22 DIAGNOSIS — F1310 Sedative, hypnotic or anxiolytic abuse, uncomplicated: Secondary | ICD-10-CM | POA: Insufficient documentation

## 2014-11-22 DIAGNOSIS — Y9289 Other specified places as the place of occurrence of the external cause: Secondary | ICD-10-CM | POA: Diagnosis not present

## 2014-11-22 DIAGNOSIS — Z791 Long term (current) use of non-steroidal anti-inflammatories (NSAID): Secondary | ICD-10-CM | POA: Insufficient documentation

## 2014-11-22 DIAGNOSIS — Y9389 Activity, other specified: Secondary | ICD-10-CM | POA: Diagnosis not present

## 2014-11-22 HISTORY — DX: Suicide attempt, initial encounter: T14.91XA

## 2014-11-22 LAB — CBC WITH DIFFERENTIAL/PLATELET
BASOS PCT: 0 % (ref 0–1)
Basophils Absolute: 0 10*3/uL (ref 0.0–0.1)
Eosinophils Absolute: 0 10*3/uL (ref 0.0–0.7)
Eosinophils Relative: 0 % (ref 0–5)
HCT: 46.1 % (ref 39.0–52.0)
Hemoglobin: 15.6 g/dL (ref 13.0–17.0)
Lymphocytes Relative: 11 % — ABNORMAL LOW (ref 12–46)
Lymphs Abs: 1.1 10*3/uL (ref 0.7–4.0)
MCH: 30.2 pg (ref 26.0–34.0)
MCHC: 33.8 g/dL (ref 30.0–36.0)
MCV: 89.3 fL (ref 78.0–100.0)
MONO ABS: 0.7 10*3/uL (ref 0.1–1.0)
Monocytes Relative: 7 % (ref 3–12)
NEUTROS ABS: 7.6 10*3/uL (ref 1.7–7.7)
NEUTROS PCT: 82 % — AB (ref 43–77)
Platelets: 176 10*3/uL (ref 150–400)
RBC: 5.16 MIL/uL (ref 4.22–5.81)
RDW: 13.5 % (ref 11.5–15.5)
WBC: 9.4 10*3/uL (ref 4.0–10.5)

## 2014-11-22 LAB — CBG MONITORING, ED: GLUCOSE-CAPILLARY: 78 mg/dL (ref 70–99)

## 2014-11-22 MED ORDER — AMMONIA AROMATIC IN INHA
RESPIRATORY_TRACT | Status: AC
  Administered 2014-11-23: 04:00:00
  Filled 2014-11-22: qty 10

## 2014-11-22 NOTE — ED Notes (Signed)
Bed: RESB Expected date:  Expected time:  Means of arrival:  Comments: EMS 56M SI/combative Haldol given
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
## 2014-11-23 ENCOUNTER — Inpatient Hospital Stay (HOSPITAL_COMMUNITY)
Admission: AD | Admit: 2014-11-23 | Discharge: 2014-11-28 | DRG: 885 | Disposition: A | Discharge status: Home / Self Care | Payer: Medicaid Other | Source: Intra-hospital | Attending: Emergency Medicine | Admitting: Emergency Medicine

## 2014-11-23 ENCOUNTER — Encounter (HOSPITAL_COMMUNITY): Payer: Self-pay | Admitting: *Deleted

## 2014-11-23 DIAGNOSIS — Z8249 Family history of ischemic heart disease and other diseases of the circulatory system: Secondary | ICD-10-CM

## 2014-11-23 DIAGNOSIS — Z609 Problem related to social environment, unspecified: Secondary | ICD-10-CM | POA: Diagnosis present

## 2014-11-23 DIAGNOSIS — R45851 Suicidal ideations: Secondary | ICD-10-CM | POA: Diagnosis present

## 2014-11-23 DIAGNOSIS — Z634 Disappearance and death of family member: Secondary | ICD-10-CM

## 2014-11-23 DIAGNOSIS — J45909 Unspecified asthma, uncomplicated: Secondary | ICD-10-CM | POA: Diagnosis present

## 2014-11-23 DIAGNOSIS — F322 Major depressive disorder, single episode, severe without psychotic features: Secondary | ICD-10-CM | POA: Insufficient documentation

## 2014-11-23 DIAGNOSIS — F419 Anxiety disorder, unspecified: Secondary | ICD-10-CM | POA: Diagnosis present

## 2014-11-23 DIAGNOSIS — F4323 Adjustment disorder with mixed anxiety and depressed mood: Secondary | ICD-10-CM | POA: Diagnosis present

## 2014-11-23 DIAGNOSIS — F17210 Nicotine dependence, cigarettes, uncomplicated: Secondary | ICD-10-CM | POA: Diagnosis present

## 2014-11-23 DIAGNOSIS — F1320 Sedative, hypnotic or anxiolytic dependence, uncomplicated: Secondary | ICD-10-CM | POA: Diagnosis present

## 2014-11-23 DIAGNOSIS — F329 Major depressive disorder, single episode, unspecified: Secondary | ICD-10-CM

## 2014-11-23 DIAGNOSIS — F1210 Cannabis abuse, uncomplicated: Secondary | ICD-10-CM | POA: Diagnosis present

## 2014-11-23 DIAGNOSIS — F908 Attention-deficit hyperactivity disorder, other type: Secondary | ICD-10-CM | POA: Diagnosis present

## 2014-11-23 DIAGNOSIS — R55 Syncope and collapse: Secondary | ICD-10-CM | POA: Insufficient documentation

## 2014-11-23 DIAGNOSIS — F1220 Cannabis dependence, uncomplicated: Secondary | ICD-10-CM | POA: Insufficient documentation

## 2014-11-23 DIAGNOSIS — F901 Attention-deficit hyperactivity disorder, predominantly hyperactive type: Secondary | ICD-10-CM | POA: Diagnosis present

## 2014-11-23 DIAGNOSIS — F323 Major depressive disorder, single episode, severe with psychotic features: Principal | ICD-10-CM | POA: Diagnosis present

## 2014-11-23 DIAGNOSIS — S0081XA Abrasion of other part of head, initial encounter: Secondary | ICD-10-CM | POA: Diagnosis not present

## 2014-11-23 LAB — COMPREHENSIVE METABOLIC PANEL
ALT: 18 U/L (ref 0–53)
ANION GAP: 4 — AB (ref 5–15)
AST: 25 U/L (ref 0–37)
Albumin: 3.6 g/dL (ref 3.5–5.2)
Alkaline Phosphatase: 45 U/L (ref 39–117)
BUN: 15 mg/dL (ref 6–23)
CO2: 24 mmol/L (ref 19–32)
CREATININE: 1.2 mg/dL (ref 0.50–1.35)
Calcium: 8.8 mg/dL (ref 8.4–10.5)
Chloride: 107 mEq/L (ref 96–112)
GFR calc Af Amer: >90 mL/min (ref >90)
GFR calc non Af Amer: 84 mL/min — ABNORMAL LOW (ref >90)
Glucose, Bld: 84 mg/dL (ref 70–99)
POTASSIUM: 3.9 mmol/L (ref 3.5–5.1)
Sodium: 135 mmol/L (ref 135–145)
TOTAL PROTEIN: 5.7 g/dL — AB (ref 6.0–8.3)
Total Bilirubin: 0.7 mg/dL (ref 0.3–1.2)

## 2014-11-23 LAB — ETHANOL

## 2014-11-23 LAB — RAPID URINE DRUG SCREEN, HOSP PERFORMED
Amphetamines: NOT DETECTED
BENZODIAZEPINES: POSITIVE — AB
Barbiturates: NOT DETECTED
Cocaine: NOT DETECTED
Opiates: NOT DETECTED
TETRAHYDROCANNABINOL: POSITIVE — AB

## 2014-11-23 LAB — SALICYLATE LEVEL

## 2014-11-23 LAB — ACETAMINOPHEN LEVEL

## 2014-11-23 MED ORDER — LORAZEPAM 2 MG/ML IJ SOLN
INTRAMUSCULAR | Status: AC
  Filled 2014-11-23: qty 1

## 2014-11-23 MED ORDER — RISPERIDONE 2 MG PO TBDP
2.0000 mg | ORAL_TABLET | Freq: Three times a day (TID) | ORAL | Status: DC | PRN
  Filled 2014-11-23: qty 1

## 2014-11-23 MED ORDER — ONDANSETRON HCL 4 MG PO TABS: 4.0000 mg | ORAL_TABLET | Freq: Three times a day (TID) | ORAL | Status: DC | PRN

## 2014-11-23 MED ORDER — ACETAMINOPHEN 325 MG PO TABS: 650.0000 mg | ORAL_TABLET | ORAL | Status: DC | PRN

## 2014-11-23 MED ORDER — SODIUM CHLORIDE 0.9 % IV BOLUS (SEPSIS)
1000.0000 mL | Freq: Once | INTRAVENOUS | Status: AC
  Administered 2014-11-23: 1000 mL via INTRAVENOUS

## 2014-11-23 MED ORDER — FLUOXETINE HCL 10 MG PO CAPS
10.0000 mg | ORAL_CAPSULE | Freq: Every day | ORAL | Status: DC
Start: 2014-11-23 — End: 2014-11-23
  Administered 2014-11-23: 10 mg via ORAL
  Filled 2014-11-23 (×2): qty 1

## 2014-11-23 MED ORDER — ALUM & MAG HYDROXIDE-SIMETH 200-200-20 MG/5ML PO SUSP
30.0000 mL | ORAL | Status: DC | PRN
  Administered 2014-11-27: 30 mL via ORAL
  Filled 2014-11-23: qty 30

## 2014-11-23 MED ORDER — ALUM & MAG HYDROXIDE-SIMETH 200-200-20 MG/5ML PO SUSP: 30.0000 mL | ORAL | Status: DC | PRN

## 2014-11-23 MED ORDER — NAPROXEN 500 MG PO TABS
500.0000 mg | ORAL_TABLET | Freq: Two times a day (BID) | ORAL | Status: DC
  Filled 2014-11-23 (×2): qty 1

## 2014-11-23 MED ORDER — ALBUTEROL SULFATE HFA 108 (90 BASE) MCG/ACT IN AERS: 1.0000 | INHALATION_SPRAY | Freq: Four times a day (QID) | RESPIRATORY_TRACT | Status: DC | PRN

## 2014-11-23 MED ORDER — LORAZEPAM 1 MG PO TABS
1.0000 mg | ORAL_TABLET | ORAL | Status: AC | PRN
  Administered 2014-11-24: 1 mg via ORAL
  Filled 2014-11-23: qty 1

## 2014-11-23 MED ORDER — TRAZODONE HCL 50 MG PO TABS
50.0000 mg | ORAL_TABLET | Freq: Every evening | ORAL | Status: DC | PRN
  Administered 2014-11-23 – 2014-11-27 (×8): 50 mg via ORAL
  Filled 2014-11-23: qty 1
  Filled 2014-11-23: qty 28
  Filled 2014-11-23 (×4): qty 1
  Filled 2014-11-23: qty 28
  Filled 2014-11-23 (×5): qty 1
  Filled 2014-11-23: qty 28
  Filled 2014-11-23 (×6): qty 1

## 2014-11-23 MED ORDER — ACETAMINOPHEN 325 MG PO TABS: 650.0000 mg | ORAL_TABLET | Freq: Four times a day (QID) | ORAL | Status: DC | PRN

## 2014-11-23 MED ORDER — LORAZEPAM 1 MG PO TABS
ORAL_TABLET | ORAL | Status: AC
  Administered 2014-11-23: 2 mg
  Filled 2014-11-23: qty 2

## 2014-11-23 MED ORDER — MAGNESIUM HYDROXIDE 400 MG/5ML PO SUSP
30.0000 mL | Freq: Every day | ORAL | Status: DC | PRN
  Administered 2014-11-27: 30 mL via ORAL
  Filled 2014-11-23: qty 30

## 2014-11-23 MED ORDER — ZIPRASIDONE MESYLATE 20 MG IM SOLR: 20.0000 mg | INTRAMUSCULAR | Status: DC | PRN

## 2014-11-23 MED ORDER — NICOTINE 21 MG/24HR TD PT24
21.0000 mg | MEDICATED_PATCH | Freq: Every day | TRANSDERMAL | Status: DC
  Administered 2014-11-23: 21 mg via TRANSDERMAL
  Filled 2014-11-23: qty 1

## 2014-11-23 MED ORDER — IBUPROFEN 200 MG PO TABS: 600.0000 mg | ORAL_TABLET | Freq: Three times a day (TID) | ORAL | Status: DC | PRN

## 2014-11-23 MED ORDER — NAPROXEN 500 MG PO TABS
500.0000 mg | ORAL_TABLET | Freq: Two times a day (BID) | ORAL | Status: DC | PRN
  Administered 2014-11-24 – 2014-11-25 (×2): 500 mg via ORAL
  Filled 2014-11-23 (×2): qty 1

## 2014-11-23 MED ORDER — ZOLPIDEM TARTRATE 5 MG PO TABS: 5.0000 mg | ORAL_TABLET | Freq: Every evening | ORAL | Status: DC | PRN

## 2014-11-23 MED ORDER — TRAZODONE HCL 100 MG PO TABS: 100.0000 mg | ORAL_TABLET | Freq: Every evening | ORAL | Status: DC | PRN

## 2014-11-23 MED ORDER — NICOTINE POLACRILEX 2 MG MT GUM
2.0000 mg | CHEWING_GUM | OROMUCOSAL | Status: DC | PRN
  Administered 2014-11-24 – 2014-11-27 (×11): 2 mg via ORAL
  Filled 2014-11-23 (×10): qty 1

## 2014-11-23 NOTE — ED Notes (Signed)
Bed: VH84 Expected date:  Expected time:  Means of arrival:  Comments: RES B

## 2014-11-23 NOTE — BH Assessment (Addendum)
Assessment Note  Garrett Carrillo is an 24 y.o. male presenting to [facility] after being petitioned by [employer] due to contacting 911 verbalizing suicidal ideations. Pt denies SI at this time but reported that earlier today he had thought to harm himself. Pt stated "I received some bad news and it came out the wrong way". Pt did not report any previous suicide attempts or psychiatric hospitalizations. Pt denies HI and AVH at this time. Pt was given Haldol prior to assessment. Pt is drowsy and did not actively engage in the assessment. Pt should be re-assessment when he is alert and oriented.   Axis I: Adjustment Disorder NOS  Past Medical History:  Past Medical History  Diagnosis Date  . ADHD (attention deficit hyperactivity disorder)   . Suicide attempt     Past Surgical History  Procedure Laterality Date  . Thumb surgery Right 2009  . Eardrum repair Right     Family History:  Family History  Problem Relation Age of Onset  . Adopted: Yes  . Hypertension Mother   . Hypertension Maternal Grandmother   . Hypertension Maternal Grandfather   . Hypertension Paternal Grandmother   . Hypertension Paternal Grandfather     Social History:  reports that he has been smoking Cigarettes.  He has been smoking about 1.00 pack per day. He does not have any smokeless tobacco history on file. He reports that he drinks alcohol. He reports that he does not use illicit drugs.  Additional Social History:  Alcohol / Drug Use History of alcohol / drug use?:  (Unable to assess at this time. )  CIWA: CIWA-Ar BP: (!) 96/50 mmHg Pulse Rate: 77 COWS:    Allergies:  Allergies  Allergen Reactions  . Penicillins Anaphylaxis  . Sulfur Anaphylaxis    Home Medications:  (Not in a hospital admission)  OB/GYN Status:  No LMP for male patient.  General Assessment Data Location of Assessment: [facility] ED Is this a Tele or Face-to-Face Assessment?: Face-to-Face Is this an Initial Assessment or a Re-assessment for this  encounter?: Initial Assessment Living Arrangements: Other (Comment) Malen Gauze parent ) Can pt return to current living arrangement?: Yes Admission Status: Involuntary Is patient capable of signing voluntary admission?: Yes Transfer from: Home Referral Source: Self/Family/Friend     [facility] Crisis Care Plan Living Arrangements: Other (Comment) Malen Gauze parent ) Name of Psychiatrist: No provider reported at this time.  Name of Therapist: No provider reported at this time.      Risk to self with the past 6 months Suicidal Ideation: No-Not Currently/Within Last 6 Months Suicidal Intent: No-Not Currently/Within Last 6 Months Is patient at risk for suicide?: Yes Suicidal Plan?: No-Not Currently/Within Last 6 Months Access to Means: Yes Specify Access to Suicidal Means: Pt has access to a knife.  What has been your use of drugs/alcohol within the last 12 months?: Unable to assess at this time.  Previous Attempts/Gestures: No (Pt denies any previous suicide attempts.) How many times?: 0 Other Self Harm Risks: Unable to assess at this time.  Triggers for Past Attempts: None known Intentional Self Injurious Behavior: None Family Suicide History: Unable to assess Recent stressful life event(s): Loss (Comment) (Death of uncle) Persecutory voices/beliefs?:  (unable to assess at this time. ) Depression:  (Unable to assess at this time. ) Substance abuse history and/or treatment for substance abuse?:  (unable to assess at this time.) Suicide prevention information given to non-admitted patients: Not applicable  Risk to Others within the past 6 months Homicidal Ideation:  No Thoughts of Harm to Others: No Current Homicidal Intent: No Current Homicidal Plan: No Access to Homicidal Means: No Identified Victim: NA History of harm to others?:  (Unable to assess at this time. ) Assessment of Violence:  (Unable to assess at this time. ) Violent Behavior Description:  (Unable to assess at this  time) Does patient have access to weapons?:  (Unable to assess at this time) Criminal Charges Pending?:  (Unable to assess at this time. ) Does patient have a court date:  (Unable to assess at this time. )  Psychosis Hallucinations: None noted Delusions: None noted  Mental Status Report Appear/Hygiene: In scrubs Eye Contact: Poor Motor Activity: Unable to assess Speech: Soft Level of Consciousness: Drowsy Mood: Euthymic Affect: Appropriate to circumstance Anxiety Level: None Thought Processes: Coherent, Relevant Judgement: Unable to Assess Orientation: Unable to assess Obsessive Compulsive Thoughts/Behaviors: Unable to Assess  Cognitive Functioning Concentration: Unable to Assess Memory: Unable to Assess IQ: Average Insight: Unable to Assess Impulse Control: Unable to Assess Appetite:  (Unable to assess at this time. ) Weight Loss:  (Unable to assess at this time. ) Weight Gain:  (Unable to assess at this time.) Sleep: Unable to Assess Vegetative Symptoms: Unable to Assess  ADLScreening [facility]) Patient's cognitive ability adequate to safely complete daily activities?:  (Unable to assess at this time) Patient able to express need for assistance with ADLs?:  (Unable to assess at this time.) Independently performs ADLs?:  (Unable to assess at this time. )  Prior Inpatient Therapy Prior Inpatient Therapy:  (Unable to assess at this time)  Prior Outpatient Therapy Prior Outpatient Therapy:  (Unable to assess at this time. )  ADL Screening (condition at time of admission) Patient's cognitive ability adequate to safely complete daily activities?:  (Unable to assess at this time) Patient able to express need for assistance with ADLs?:  (Unable to assess at this time.) Independently performs ADLs?:  (Unable to assess at this time. )       Abuse/Neglect Assessment (Assessment to be complete while patient is alone) Physical Abuse:  (Unable to assess at  this time. ) Verbal Abuse:  (Unable to assess at this time. ) Sexual Abuse:  (Unable to assess at this time.) Exploitation of patient/patient's resources:  (Unable to assess at this time. ) Self-Neglect:  (Unable to assess at this time.)     Advance Directives (For Healthcare) Does patient have an advance directive?: No Would patient like information on creating an advanced directive?: No - patient declined information    Additional Information 1:1 In Past 12 Months?: No CIRT Risk: Yes Elopement Risk: Yes Does patient have medical clearance?: Yes     Disposition: Psychiatric evaluation in the morning.  Disposition Initial Assessment Completed for this Encounter: Yes  On Site Evaluation by:   Reviewed with Physician:    Alante Tolan S 11/23/2014 1:40 AM

## 2014-11-23 NOTE — ED Notes (Signed)
Pt on phone w/ family member, crying on phone

## 2014-11-23 NOTE — ED Notes (Signed)
Pt belongings placed in locker #40

## 2014-11-23 NOTE — ED Notes (Signed)
Patient resting in position of comfort with eyes closed RR WNL--even and unlabored with equal rise and fall of chest Patient in NAD  

## 2014-11-23 NOTE — ED Notes (Signed)
Pts girlfriend and son at bedside.
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
## 2014-11-23 NOTE — ED Notes (Addendum)
Patient and visitor moved to Dayroom due not not feeling safe. Patient in room 41 continues to watch patient and visitor. Encouragement and support provided and safety maintain.

## 2014-11-23 NOTE — ED Notes (Signed)
Pt transported to [facility] by [employer] for continuation of specialized care. He left in no acute distress. 
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
## 2014-11-23 NOTE — ED Notes (Signed)
Pt has been calm and cooperative since arrival to the ED.  Pt has not been verbally or physically aggressive w/ staff or [employer] officers that were at bedside.
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
## 2014-11-23 NOTE — ED Notes (Signed)
D/w Greta Doom, PA orthostatic VS and expressed my concern for pt's risk of fall at this time.  Greta Doom, PA is in agreement will attempt vs when pt is more alert.

## 2014-11-23 NOTE — BH Assessment (Signed)
[facility] Assessment Progress Note  Pt has been accepted to [facility] by Thedore Mins, MD. Per Berneice Heinrich, RN, [facility], pt is assigned to Rm [id number]. Pt has signed Consent to Release Information. Signed form have been faxed to [facility], as well as IVC paperwork. Pt's nurse has been notified, but pt is not to be transferred until after 20:02. He agrees to send original form to [facility] along with the pt via [employer], and to call report to [phone].  Doylene Canning, MA Triage Specialist 11/23/2014 @ 16:26

## 2014-11-24 ENCOUNTER — Inpatient Hospital Stay (HOSPITAL_COMMUNITY): Payer: Medicaid Other

## 2014-11-24 ENCOUNTER — Encounter (HOSPITAL_COMMUNITY): Payer: Self-pay | Admitting: Psychiatry

## 2014-11-24 DIAGNOSIS — F909 Attention-deficit hyperactivity disorder, unspecified type: Secondary | ICD-10-CM

## 2014-11-24 DIAGNOSIS — F1320 Sedative, hypnotic or anxiolytic dependence, uncomplicated: Secondary | ICD-10-CM | POA: Diagnosis present

## 2014-11-24 DIAGNOSIS — R55 Syncope and collapse: Secondary | ICD-10-CM | POA: Insufficient documentation

## 2014-11-24 DIAGNOSIS — F1220 Cannabis dependence, uncomplicated: Secondary | ICD-10-CM | POA: Insufficient documentation

## 2014-11-24 DIAGNOSIS — F4323 Adjustment disorder with mixed anxiety and depressed mood: Secondary | ICD-10-CM

## 2014-11-24 DIAGNOSIS — F1290 Cannabis use, unspecified, uncomplicated: Secondary | ICD-10-CM

## 2014-11-24 DIAGNOSIS — F1210 Cannabis abuse, uncomplicated: Secondary | ICD-10-CM | POA: Diagnosis present

## 2014-11-24 LAB — COMPREHENSIVE METABOLIC PANEL
ALBUMIN: 3.2 g/dL — AB (ref 3.5–5.2)
ALT: 20 U/L (ref 0–53)
AST: 33 U/L (ref 0–37)
Alkaline Phosphatase: 42 U/L (ref 39–117)
Anion gap: 14 (ref 5–15)
BUN: 8 mg/dL (ref 6–23)
CO2: 19 mmol/L (ref 19–32)
Calcium: 8.7 mg/dL (ref 8.4–10.5)
Chloride: 108 mEq/L (ref 96–112)
Creatinine, Ser: 1.07 mg/dL (ref 0.50–1.35)
GFR calc Af Amer: >90 mL/min (ref >90)
GFR calc non Af Amer: >90 mL/min (ref >90)
GLUCOSE: 96 mg/dL (ref 70–99)
POTASSIUM: 4.3 mmol/L (ref 3.5–5.1)
SODIUM: 141 mmol/L (ref 135–145)
TOTAL PROTEIN: 5.1 g/dL — AB (ref 6.0–8.3)
Total Bilirubin: 0.4 mg/dL (ref 0.3–1.2)

## 2014-11-24 LAB — URINALYSIS, ROUTINE W REFLEX MICROSCOPIC
Bilirubin Urine: NEGATIVE
Glucose, UA: NEGATIVE mg/dL
HGB URINE DIPSTICK: NEGATIVE
Ketones, ur: NEGATIVE mg/dL
Leukocytes, UA: NEGATIVE
Nitrite: NEGATIVE
PROTEIN: NEGATIVE mg/dL
SPECIFIC GRAVITY, URINE: 1.012 (ref 1.005–1.030)
UROBILINOGEN UA: 0.2 mg/dL (ref 0.0–1.0)
pH: 6 (ref 5.0–8.0)

## 2014-11-24 LAB — CBC WITH DIFFERENTIAL/PLATELET
BASOS ABS: 0 10*3/uL (ref 0.0–0.1)
BASOS PCT: 0 % (ref 0–1)
EOS ABS: 0.1 10*3/uL (ref 0.0–0.7)
Eosinophils Relative: 2 % (ref 0–5)
HCT: 41.6 % (ref 39.0–52.0)
Hemoglobin: 13.6 g/dL (ref 13.0–17.0)
Lymphocytes Relative: 36 % (ref 12–46)
Lymphs Abs: 1.9 10*3/uL (ref 0.7–4.0)
MCH: 29.3 pg (ref 26.0–34.0)
MCHC: 32.7 g/dL (ref 30.0–36.0)
MCV: 89.7 fL (ref 78.0–100.0)
Monocytes Absolute: 0.5 10*3/uL (ref 0.1–1.0)
Monocytes Relative: 9 % (ref 3–12)
NEUTROS ABS: 2.8 10*3/uL (ref 1.7–7.7)
Neutrophils Relative %: 53 % (ref 43–77)
Platelets: 165 10*3/uL (ref 150–400)
RBC: 4.64 MIL/uL (ref 4.22–5.81)
RDW: 14 % (ref 11.5–15.5)
WBC: 5.3 10*3/uL (ref 4.0–10.5)

## 2014-11-24 MED ORDER — HYDROXYZINE HCL 25 MG PO TABS
25.0000 mg | ORAL_TABLET | Freq: Four times a day (QID) | ORAL | Status: AC | PRN
  Administered 2014-11-25: 25 mg via ORAL
  Filled 2014-11-24: qty 1

## 2014-11-24 MED ORDER — CHLORDIAZEPOXIDE HCL 25 MG PO CAPS
25.0000 mg | ORAL_CAPSULE | Freq: Three times a day (TID) | ORAL | Status: AC
  Administered 2014-11-25 (×3): 25 mg via ORAL
  Filled 2014-11-24 (×3): qty 1

## 2014-11-24 MED ORDER — THIAMINE HCL 100 MG/ML IJ SOLN
100.0000 mg | Freq: Once | INTRAMUSCULAR | Status: DC
Start: 2014-11-24 — End: 2014-11-28

## 2014-11-24 MED ORDER — LOPERAMIDE HCL 2 MG PO CAPS: 2.0000 mg | ORAL_CAPSULE | ORAL | Status: AC | PRN

## 2014-11-24 MED ORDER — CHLORDIAZEPOXIDE HCL 25 MG PO CAPS
25.0000 mg | ORAL_CAPSULE | Freq: Four times a day (QID) | ORAL | Status: AC
  Administered 2014-11-24 (×3): 25 mg via ORAL
  Filled 2014-11-24 (×3): qty 1

## 2014-11-24 MED ORDER — CHLORDIAZEPOXIDE HCL 25 MG PO CAPS
25.0000 mg | ORAL_CAPSULE | ORAL | Status: AC
  Administered 2014-11-26 (×2): 25 mg via ORAL
  Filled 2014-11-24 (×2): qty 1

## 2014-11-24 MED ORDER — CHLORDIAZEPOXIDE HCL 25 MG PO CAPS
25.0000 mg | ORAL_CAPSULE | Freq: Four times a day (QID) | ORAL | Status: AC | PRN
  Administered 2014-11-25: 25 mg via ORAL
  Filled 2014-11-24: qty 1

## 2014-11-24 MED ORDER — CHLORDIAZEPOXIDE HCL 25 MG PO CAPS
25.0000 mg | ORAL_CAPSULE | Freq: Every day | ORAL | Status: AC
  Administered 2014-11-27: 25 mg via ORAL
  Filled 2014-11-24: qty 1

## 2014-11-24 MED ORDER — VITAMIN B-1 100 MG PO TABS
100.0000 mg | ORAL_TABLET | Freq: Every day | ORAL | Status: DC
  Administered 2014-11-25 – 2014-11-28 (×4): 100 mg via ORAL
  Filled 2014-11-24 (×7): qty 1

## 2014-11-24 MED ORDER — ADULT MULTIVITAMIN W/MINERALS CH
1.0000 | ORAL_TABLET | Freq: Every day | ORAL | Status: DC
  Administered 2014-11-25 – 2014-11-28 (×4): 1 via ORAL
  Filled 2014-11-24 (×9): qty 1

## 2014-11-24 MED ORDER — LORAZEPAM 2 MG/ML IJ SOLN
INTRAMUSCULAR | Status: AC
  Filled 2014-11-24: qty 1

## 2014-11-24 MED ORDER — ONDANSETRON 4 MG PO TBDP: 4.0000 mg | ORAL_TABLET | Freq: Four times a day (QID) | ORAL | Status: AC | PRN

## 2014-11-24 MED ORDER — LORAZEPAM 2 MG/ML IJ SOLN
1.0000 mg | Freq: Once | INTRAMUSCULAR | Status: AC
  Administered 2014-11-24: 1 mg via INTRAMUSCULAR

## 2014-11-24 NOTE — Progress Notes (Signed)
[facility] and once medically cleared [facility].    Once medically cleared have patient transported by police due to IVC.  Deretha EmoryHannah Ashley Montminy LCSW, MSW Clinical Social Work: Emergency Room [phone]

## 2014-11-24 NOTE — Tx Team (Signed)
Interdisciplinary Treatment Plan Update (Adult)   Date: 11/24/2014  Time Reviewed:9:07 AM  Progress in Treatment:  Attending groups: No Participating in groups:  No Taking medication as prescribed: Yes  Tolerating medication: Yes  Family/Significant othe contact made: Not yet. SPE required for pt.  Patient understands diagnosis: Yes, AEB seeking treatment for SI, mood instability, for medication stabilization, and xanax abuse.  Discussing patient identified problems/goals with staff: Yes  Medical problems stabilized or resolved: Yes  Denies suicidal/homicidal ideation: Passive SI/able to contract for safety.  Patient has not harmed self or Others: Yes  New problem(s) identified:  Discharge Plan or Barriers:  CSW assessing for appropriate referrals. Pt lives [place] foster mother [place].  Additional comments:  Garrett Carrillo is an 24 y.o. male [facility] after being petitioned by [employer] due to contacting 911 verbalizing suicidal ideations. Pt denies SI at this time but reported that earlier today he had thought to harm himself. Pt stated "I received some bad news and it came out the wrong way". Pt did not report any previous suicide attempts or psychiatric hospitalizations. Pt denies HI and AVH at this time. Pt was given Haldol prior to assessment. Pt is drowsy and did not actively engage in the assessment. Pt should be re-assessment when he is alert and oriented.  Reason for Continuation of Hospitalization: SI Withdrawals-xanax abuse Mood instability Medication stabilization Estimated length of stay: 5-7 days  For review of initial/current patient goals, please see plan of care.  Attendees:  Patient:    Family:    Physician: Dr. Elna BreslowEappen, MD 11/24/2014 9:06 AM   Nursing: Noreene FilbertPatrice, Marian RN 11/24/2014 9:06 AM   Clinical Social Worker Preslee Regas Smart, LCSWA  11/24/2014 9:06 AM   Other: Richelle Itood North, LCSW 11/24/2014 9:06 AM   Other: Darden DatesJennifer C. Nurse CM 11/24/2014 9:06 AM   Other:  Liliane Badeolora Sutton, Community Care Coordinator  11/24/2014 9:06 AM   Other:    Scribe for Treatment Team:  The Sherwin-WilliamsHeather Smart LCSWA 11/24/2014 9:07 AM

## 2014-11-24 NOTE — Discharge Instructions (Signed)
Nonepileptic Seizures Nonepileptic seizures are seizures that are not caused by abnormal electrical signals in your brain. These seizures often seem like epileptic seizures, but they are not caused by epilepsy.  There are two types of nonepileptic seizures:  A physiologic nonepileptic seizure results from a disruption in your brain.  A psychogenic seizure results from emotional stress. These seizures are sometimes called pseudoseizures. CAUSES  Causes of physiologic nonepileptic seizures include:   Sudden drop in blood pressure.  Low blood sugar.  Low levels of salt (sodium) in your blood.  Low levels of calcium in your blood.  Migraine.  Heart rhythm problems.  Sleep disorders.  Drug and alcohol abuse. Common causes of psychogenic nonepileptic seizures include:  Stress.  Emotional trauma.  Sexual or physical abuse.  Major life events, such as divorce or the death of a loved one.  Mental health disorders, including panic attack and hyperactivity disorder. SIGNS AND SYMPTOMS A nonepileptic seizure can look like an epileptic seizure, including uncontrollable shaking (convulsions), or changes in attention, behavior, or the ability to remain awake and alert. However, there are some differences. Nonepileptic seizures usually:  Do not cause physical injuries.  Start slowly.  Include crying or shrieking.  Last longer than 2 minutes.  Have a short recovery time without headache or exhaustion. DIAGNOSIS  Your health care provider can usually diagnose nonepileptic seizures after taking your medical history and giving you a physical exam. Your health care provider may want to talk to your friends or relatives who have seen you have a seizure.  You may also need to have tests to look for causes of physiologic nonepileptic seizures. This may include an electroencephalogram (EEG), which is a test that measures electrical activity in your brain. If you have had an epileptic  seizure, the results of your EEG will be abnormal. If your health care provider thinks you have had a psychogenic nonepileptic seizure, you may need to see a mental health specialist for an evaluation. TREATMENT  Treatment depends on the type and cause of your seizures.  For physiologic nonepileptic seizures, treatment is aimed at addressing the underlying condition that caused the seizures. These seizures usually stop when the underlying condition is properly treated.  Nonepileptic seizures do not respond to the seizure medicines used to treat epilepsy.  For psychogenic seizures, you may need to work with a mental health specialist. HOME CARE INSTRUCTIONS Home care will depend on the type of nonepileptic seizures you have.   Follow all your health care provider's instructions.  Keep all your follow-up appointments. SEEK MEDICAL CARE IF: You continue to have seizures after treatment. SEEK IMMEDIATE MEDICAL CARE IF:  Your seizures change or become more frequent.  You injure yourself during a seizure.  You have one seizure after another.  You have trouble recovering from a seizure.  You have chest pain or trouble breathing. MAKE SURE YOU:  Understand these instructions.  Will watch your condition.  Will get help right away if you are not doing well or get worse. Document Released: 12/22/2005 Document Revised: 03/23/2014 Document Reviewed: 09/02/2013 [facility] Patient Information 2015 [place]. This information is not intended to replace advice given to you by your health care provider. Make sure you discuss any questions you have with your health care provider.   Emergency Department Resource Guide 1) Find a Doctor and Pay Out of Pocket Although you won't have to find out who is covered by your insurance plan, it is a good idea to ask around and  get recommendations. You will then need to call the office and see if the doctor you have chosen will accept you as a new  patient and what types of options they offer for patients who are self-pay. Some doctors offer discounts or will set up payment plans for their patients who do not have insurance, but you will need to ask so you aren't surprised when you get to your appointment.  2) Contact Your Local Health Department Not all health departments have doctors that can see patients for sick visits, but many do, so it is worth a call to see if yours does. If you don't know where your local health department is, you can check in your phone book. The CDC also has a tool to help you locate your state's health department, and many state websites also have listings of all of their local health departments.  3) Find a Walk-in Clinic If your illness is not likely to be very severe or complicated, you may want to try a walk in clinic. These are popping up all over the country in pharmacies, drugstores, and shopping centers. They're usually staffed by nurse practitioners or physician assistants that have been trained to treat common illnesses and complaints. They're usually fairly quick and inexpensive. However, if you have serious medical issues or chronic medical problems, these are probably not your best option.  No Primary Care Doctor: - Call Health Connect at  [phone] - they can help you locate a primary care doctor that  accepts your insurance, provides certain services, etc. - Physician Referral Service- [phone]  Chronic Pain Problems: Organization         Address  Phone   Notes  [facility]  [phone] Patients need to be referred by their primary care doctor.   Medication Assistance: Organization         Address  Phone   Notes  [facility] Medication [facility] [address] [place], [place] [address] [phone] --Must be a resident of [facility] -- Must have NO insurance coverage whatsoever (no Medicaid/ Medicare, etc.) -- The pt. MUST have a primary  care doctor that directs their care regularly and follows them in the community   [facility]ist  [phone]   [employer]  [phone]    Agencies that provide inexpensive medical care: Organization         Address  Phone   Notes  [facility]  [phone]   [facility]    [phone]   [facility] [address] [place], [place] [address] [phone]   [facility] [address] [place], [place] [phone]   Planned Parenthood    [phone]   [facility]    [phone]   [facility]  [address], [place] Phone:  [phone], Fax:  [phone] Hours of Operation:  9 am - 6 pm, M-F.  Also accepts Medicaid/Medicare and self-pay.  [facility]  [address], [place] Phone: [phone], Fax: [phone]. Hours of Operation:  8:30 am - 5:30 pm, M-F.  Also accepts Medicaid and self-pay.  [facility] [place] [address], [place] Phone: [phone]   [facility] [address] Natasha Bence [place], [place] [phone], Ext. [phone] Mondays & Thursdays: 7-9 AM.  First 15 patients are seen on a  first come, first serve basis.    Medicaid-accepting [facility] Providers:  Organization         Address  Phone   Notes  [facility] [address], [place] [phone] Also accepts self-pay patients.  [facility] [address] Laurell Josephs [place], [place]  [phone]   [facility] [address], [place] [phone]   [facility] [address], [place] [phone]   Renaye Rakers [address], [place]   [phone] Only accepts [place] Access [place] patients after they have their name applied to their card.   Self-Pay (no insurance) in [facility]:  Organization         Address  Phone   Notes  Sickle Cell Patients, [facility] [address] [place], [place] [phone]   [facility] [address] [place], [place] [phone]   [facility] [place]  [address], [place] [phone]   [facility]/Dr. Osei-Bonsu  [address] [place] or [address], [place] [phone] Phone number for both [place] and [place] locations is the same.  [facility] [address] [place] [phone]   [facility] [address] [place] or [address] [phone]   [facility] [address] Benjamin Perez [phone], phone; [phone], fax Sees patients 1st and 3rd Saturday of every month.  Must not qualify for public or private insurance (i.e. Medicaid, Medicare, [place] Health Choice, Veterans' Benefits)  Household income should be no more than 200% of the poverty level The clinic cannot treat you if you are pregnant or think you are pregnant  Sexually transmitted diseases are not treated at the clinic.    Dental Care: Organization         Address  Phone  Notes  [facility] Department of [facility] [address] [place], [place] [phone] Accepts children up to age 56 who are enrolled in [place] or [place] Health Choice; pregnant women with a Medicaid card; and children who have applied for Medicaid or [place] Health Choice, but were declined, whose parents can pay a reduced fee at time of service.  [facility] Department of [facility]  [address], [place] [phone] Accepts children up to age 72 who are enrolled in [place] or [place] Health Choice; pregnant women with a Medicaid card; and children who have applied for Medicaid or [place] Health Choice, but were declined, whose parents can  pay a reduced fee at time of service.  [facility]  [address] [place], [place] [phone] Patients are seen by appointment only. Walk-ins are not accepted. [facility] will see patients 36 years of age and older. Monday - Tuesday (8am-5pm) Most Wednesdays (8:30-5pm) $30 per visit, cash only  [facility] Adult Dental Access PRO[facility]  [address], [facility] [phone] Patients are seen by appointment only. Walk-ins are not accepted. [facility] will see patients 40 years of age and older. One Wednesday Evening (Monthly: Volunteer Based).  $30 per visit, cash only  [employer]  [phone] for adults; Children under age 34, call Graduate Pediatric Dentistry at [phone]. Children aged  4-14, please call [phone] to request a pediatric application.  Dental services are provided in all areas of dental care including fillings, crowns and bridges, complete and partial dentures, implants, gum treatment, root canals, and extractions. Preventive care is also provided. Treatment is provided to both adults and children. Patients are selected via a lottery and there is often a waiting list.   [facility] [address], [place]  [phone] [email or web address]   [facility] [address], [place], [place] [phone], Ext. [phone] Second and Fourth Thursday of each month, opens at 6:30 AM; Clinic ends at 9 AM.  Patients are seen on a first-come first-served basis, and a limited number are seen during each clinic.   [facility]  [address], [place], [place] [phone]   Eligibility Requirements You must have lived in [place], [place], or [place] counties for at least the last three months.   You cannot be eligible for state or federal sponsored [place] Cityhealthcare insurance, including CIGNAVeterans Administration, [place]icaid, or [employer]icare.   You generally cannot be eligible for healthcare  insurance through your employer.    How to apply: Eligibility screenings are held every Tuesday and Wednesday afternoon from 1:00 pm until 4:00 pm. You do not need an appointment for the interview!  [facility] [address], [place], [place] [phone]   [facility]  [phone]   [facility]  [phone]   [facility]  [phone]    Behavioral Health Resources in the Community: Intensive Outpatient Programs Organization         Address  Phone  Notes  [facility] [address], [place], [place] [phone]   [facility] [address], [place], [place] [phone]   [facility] [address], [place], [place]  [phone]   [facility] [address],  [place], [place] [phone] or [phone]   Substance Abuse Resources Organization         Address  Phone  Notes  [facility]  [phone]   [facility]  [phone]   [facility]  [phone]   [facility]  [phone]   Residential & Outpatient Substance Abuse Program  [phone]   Psychological Services Organization         Address  Phone  Notes  [facility]  [phone]- [phone]   [facility]  [phone]- [phone]   [facility] [address], [place] [phone] or [phone]    Mobile Crisis Teams Organization         Address  Phone  Notes  [facility], Mobile Crisis Care Unit  [phone]   [facility]  [address] [place], [place] [phone]   Doristine LocksSharon DeEsch [address] [place] [phone]    Self-Help/Support Groups Organization         Address  Phone             Notes  [facility] [place] - variety of support groups  336- [phone] Call for more information  Narcotics Anonymous (NA),  [facility] [address], [place]  2 meetings at this location   Statisticianesidential Treatment Programs Organization         Address  Phone  Notes  [facility] [address],    [place]  [phone]  [facility]  [address], [place] [address], [place], [place] [phone]   [facility] [address] [place], [place] [phone] Admissions: 8am-3pm M-F  [facility] [address]    [place], [place] [phone]   [facility] [address] [place], [place], [place] [phone]   [facility] [address]  [place], [place] [phone]   [facility] - Intensive Outpatient [address] Laurell Josephs [address], [place], [place] [phone]   [facility] ([facility] [address] [place]  [place], [place] [phone] or [phone]   [facility] ([facility]) [address] [place], [place] [phone] Accepts Medicaid  [facility] [address],  [place] [phone] Substance Abuse/Addiction Treatment   [facility] Organization         Address  Phone  Notes  [facility]  [phone]   Angie Fava, PhD [address] Ervin Knack [place], [place]   [phone] or [phone]   [facility]   [address] [place], [place] [phone]   [facility] [address], [place], [place] [phone] Insurance/Medicaid/sponsorship through [facility] [address]                                    [place], [place] [phone] Therapy/tele-psych/case  [facility] [address], [place] [phone]    Dr. Lolly Mustache  [phone]   [facility]  [employer] [facility] Dept. 1) [address], [place] 2) [address] [place] 3)  [address], [place] [phone]  [phone]    [facility] Child Abuse Hotline [phone] or [phone] (After Hours)

## 2014-11-24 NOTE — ED Notes (Signed)
CALLED [facility] FOR TRANSPORT D/T PT IS IVC. CALLED TONIA, [facility], AND REQUESTED SHE SEND A COPY OF IVC PAPERS.
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
## 2014-11-24 NOTE — Progress Notes (Signed)
Pt given 1 mg of Ativan, IM (right deltoid) per Nicole KindredAgnes, NP for possible seizure. Writer not able to waste 1 mg of Ativan in pyxis. Writer notified Pharmacist PolvaderaElena, and LeonBeverly, Charity fundraiserN., witness remaining dose in vial. Pharmacist also verified remaining dose with Clinical research associatewriter. Remaining dose placed in sharps container by Clinical research associatewriter.
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
## 2014-11-24 NOTE — Progress Notes (Signed)
Wasted ativan 1mg  with patrice, Charity fundraiserN .  Patient transferred and unable to complete waste in pyxis

## 2014-11-24 NOTE — ED Notes (Signed)
Spoke with phyllis at [facility], stated transport would arrive approx 1730

## 2014-11-24 NOTE — ED Notes (Signed)
Per ems, pt transferred from [facility] for seizure like activity lasting approx 3-5 seconds.  Per witness, pt was awake throughout with no postictal period noted.
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
## 2014-11-24 NOTE — Progress Notes (Signed)
Adult Psychoeducational Group Note  Date:  11/24/2014 Time:  9:03 PM  Group Topic/Focus:  Wrap-Up Group:   The focus of this group is to help patients review their daily goal of treatment and discuss progress on daily workbooks.  Participation Level:  Active  Participation Quality:  Appropriate  Affect:  Appropriate  Cognitive:  Appropriate  Insight: Appropriate  Engagement in Group:  Engaged  Modes of Intervention:  Discussion  Additional Comments:  Pt stated his day could have been better if he didn't experience an anxiety attack. Pt did mention his day was good due to the visit with his sister and child's mother.  Casilda CarlsKELLY, Terris Bodin H 11/24/2014, 9:03 PM

## 2014-11-24 NOTE — [facility] Suicide Risk Assessment (Signed)
   Nursing information obtained from:  Patient, Review of record Demographic factors:  Male, Adolescent or young adult, Low socioeconomic status, Unemployed Current Mental Status:  Self-harm thoughts Loss Factors:  Loss of significant relationship, Financial problems / change in socioeconomic status Historical Factors:  Prior suicide attempts, Family history of mental illness or substance abuse Risk Reduction Factors:  Responsible for children under 24 years of age, Living with another person, especially a relative, Positive social support Total Time spent with patient: 30 minutes  CLINICAL FACTORS:   Depression:   Comorbid alcohol abuse/dependence  Psychiatric Specialty Exam: Physical Exam  ROS  Blood pressure 108/60, pulse 79, temperature 97.9 F (36.6 C), temperature source Oral, resp. rate 20, height 5\' 11"  (1.803 m), weight 86.637 kg (191 lb), SpO2 98 %.Body mass index is 26.65 kg/(m^2).  General Appearance: Disheveled  Eye SolicitorContact::  Fair  Speech:  Normal Rate  Volume:  Decreased  Mood:  Anxious and Depressed  Affect:  Restricted  Thought Process:  Linear  Orientation:  Full (Time, Place, and Person)  Thought Content:  WDL  Suicidal Thoughts:  No  Homicidal Thoughts:  No  Memory:  Immediate;   Fair Recent;   Fair Remote;   Fair  Judgement:  Impaired  Insight:  Shallow  Psychomotor Activity:  Normal  Concentration:  Poor  Recall:  [employer]is[employer]Fair  Fund of Knowledge:Fair  Language: Fair  Akathisia:  No  Handed:  Right  AIMS (if indicated):     Assets:  Desire for Improvement  Sleep:  Number of Hours: 6.5   Musculoskeletal: Strength & Muscle Tone: within normal limits Gait & Station: normal Patient leans: Right  COGNITIVE FEATURES THAT CONTRIBUTE TO RISK:  Closed-mindedness Polarized thinking Thought constriction (tunnel vision)    SUICIDE RISK:   Severe:  Frequent, intense, and enduring suicidal ideation, specific plan, no subjective intent, but some objective markers  of intent (i.e., choice of lethal method), the method is accessible, some limited preparatory behavior, evidence of impaired self-control, severe dysphoria/symptomatology, multiple risk factors present, and few if any protective factors, particularly a lack of social support.  PLAN OF CARE:Please see H&P.   I certify that inpatient services furnished can reasonably be expected to improve the patient's condition.  Garrett Brueckner MD 11/24/2014, 12:20 PM
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
## 2014-11-25 DIAGNOSIS — F332 Major depressive disorder, recurrent severe without psychotic features: Secondary | ICD-10-CM

## 2014-11-25 DIAGNOSIS — Z634 Disappearance and death of family member: Secondary | ICD-10-CM

## 2014-11-25 MED ORDER — RISPERIDONE 0.5 MG PO TBDP
0.5000 mg | ORAL_TABLET | Freq: Every day | ORAL | Status: DC
  Administered 2014-11-25 – 2014-11-27 (×3): 0.5 mg via ORAL
  Filled 2014-11-25 (×4): qty 1
  Filled 2014-11-25: qty 14

## 2014-11-25 MED ORDER — LORAZEPAM 2 MG/ML IJ SOLN
INTRAMUSCULAR | Status: AC
  Administered 2014-11-25: 1 mg
  Filled 2014-11-25: qty 1

## 2014-11-25 MED ORDER — FLUOXETINE HCL 20 MG PO CAPS
20.0000 mg | ORAL_CAPSULE | Freq: Every day | ORAL | Status: DC
  Administered 2014-11-25 – 2014-11-26 (×2): 20 mg via ORAL
  Filled 2014-11-25 (×4): qty 1

## 2014-11-25 MED ORDER — BENZTROPINE MESYLATE 0.5 MG PO TABS
0.5000 mg | ORAL_TABLET | Freq: Every day | ORAL | Status: DC
  Administered 2014-11-25 – 2014-11-27 (×3): 0.5 mg via ORAL
  Filled 2014-11-25: qty 1
  Filled 2014-11-25: qty 14
  Filled 2014-11-25 (×3): qty 1

## 2014-11-25 MED ORDER — OLANZAPINE 5 MG PO TBDP
5.0000 mg | ORAL_TABLET | Freq: Four times a day (QID) | ORAL | Status: DC | PRN
  Administered 2014-11-26 – 2014-11-27 (×2): 5 mg via ORAL
  Filled 2014-11-25 (×3): qty 1

## 2014-11-25 MED ORDER — OLANZAPINE 10 MG IM SOLR: 5.0000 mg | Freq: Four times a day (QID) | INTRAMUSCULAR | Status: DC | PRN

## 2014-11-25 MED ORDER — HALOPERIDOL LACTATE 5 MG/ML IJ SOLN
INTRAMUSCULAR | Status: AC
  Administered 2014-11-25: 5 mg
  Filled 2014-11-25: qty 1

## 2014-11-25 MED ORDER — DIPHENHYDRAMINE HCL 50 MG/ML IJ SOLN
INTRAMUSCULAR | Status: AC
  Administered 2014-11-25: 25 mg
  Filled 2014-11-25: qty 1
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
## 2014-11-25 NOTE — Progress Notes (Signed)
D: Patient presents with anxious affect and mood; agitated at times. He reported on the self inventory sheet that his sleep, appetite and ability to concentrate are all good and energy level is normal. Patient rates depression "9" and anxiety "5". His goal today is to work on anger problems. Earlier, he was attending groups, but now after having to be given IM injections due to aggressive behavior, the patient has been resting in bed for a few hours. In compliance with medication regimen.  A: Support and encouragement provided to patient. Scheduled medications administered per MD orders. Maintain Q15 minute checks for safety.  R: Patient receptive. Denies SI/HI and AVH. Patient remains safe on the hall.

## 2014-11-25 NOTE — Progress Notes (Signed)
1:1 Nursing Note  D: Patient is pacing up and down the hall and conversing with Minerva AreolaEric, [facility]; no s/s of distress noted; even labored breathing; slow but logical/coherent speech.  A: Monitor 1:1 observation and Q15 minute checks for safety.  R: Patient receptive and remains safe on the unit.
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
## 2014-11-25 NOTE — BHH Group Notes (Signed)
[facility] LCSW [facility] Discharge Planning Group Note   11/25/2014 2:07 PM  Participation Quality:  Minimal   Mood/Affect:  Depressed and Flat  Depression Rating:  Did not rate   Anxiety Rating:  High   Thoughts of Suicide:  No Will you contract for safety?   NA  Current AVH:  No  Plan for Discharge/Comments:  Pt reports that he is not sure what happened yesterday and was resistant to discussing his trip [facility]. Pt reports he had panic attack. Pt reports xanax abuse but is asking for xanax, stating that "it is the only thing that helps with my anxiety." Pt plans to move to [place] with his sister at d/c until his foster parents can find him apt and car in [place] area. Pt open to [facility] being that he plans to move back to [place] shortly.   Transportation Means: unknown at this time   Supports: foster Research scientist (medical)parents/siblings   Smart, OncologistHeather LCSWA

## 2014-11-25 NOTE — Progress Notes (Signed)
Adult Psychoeducational Group Note  Date:  11/25/2014 Time:  11:04 PM  Group Topic/Focus:  Wrap-Up Group:   The focus of this group is to help patients review their daily goal of treatment and discuss progress on daily workbooks.  Participation Level:  Minimal  Participation Quality:  Appropriate  Affect:  Lethargic  Cognitive:  Lacking  Insight: Limited  Engagement in Group:  Limited  Modes of Intervention:  Socialization and Support  Additional Comments:  Patient attended and participated in group tonight. He reports that his day was horrible. He put himself in a situation that he shouldn't have. He learn that to avoid the situation from happening again he should just stay calm and don't let his anger control him.  Lita MainsFrancis, [facility] LPDacosta 11/25/2014, 11:04 PM

## 2014-11-25 NOTE — Progress Notes (Signed)
Patient voiced to the MHT, Andreia that he feels that if someone looks at him the wrong way he will knock their head off. Writer reported this information to MD, Eappen and she ordered to have a no roommate again.

## 2014-11-25 NOTE — Progress Notes (Signed)
D: Pt denies SI/HI/AVH. Pt is pleasant and cooperative. Pt wants to "work on his anger and his attitude" Pt said he was dizzy before going to bed tonight, pt was helped to room and put in the bed. Pt pressed the call bell later then complained of being very anxious and needed talking too. Pt eventually calmed down and started coloring.   A: Pt was offered support and encouragement. Pt was given scheduled medications. Pt was encourage to attend groups. Q 15 minute checks were done for safety.   R:Pt attends groups and interacts well with peers and staff. Pt is taking medication. Pt has no complaints at this time .Pt receptive to treatment and safety maintained on unit.

## 2014-11-25 NOTE — Progress Notes (Addendum)
Nursing 1:1 note D:Pt observed on hallway using phone. RR even and unlabored. No distress noted.Pt was informed not to get upset or angry , and if he started to feel anxious he would have to get off. A: 1:1 observation continues for safety  R: pt remains safe    D: Pt denies SI/HI/AVH. Pt is pleasant and cooperative. Pt agitated, irritated and anxious. Pt does sit and talk to Clinical research associatewriter. Pt appears to listen . Pt   A: Pt was offered support and encouragement. Pt was given scheduled medications. Pt was encourage to attend groups. Q 15 minute checks were done for safety.   R:Pt attends groups and interacts well with peers and staff. Pt is taking medication. Pt receptive to treatment and safety maintained on unit.

## 2014-11-25 NOTE — Progress Notes (Addendum)
[facility] MD Progress Note  11/25/2014 12:53 PM Garrett Carrillo  MRN:  [id number] Subjective: Patient states "I am feeling a lot of anxiety ." Objective:Patient seen and chart reviewed.Patient seen in his room this AM. I have reviewed his care with staff. Patient today appears to be quiet withdrawn ,reports anxiety issues and wanting to help with the same. Patient was taken to ED yesterday where he had CT scan of his head - negative . Patient this AM reported that he gets these spells when some one asks him a lot of questions about his past. Patient did have a few episodes in the past -per report from patient. Patient denied SI/HI/AH/VH. Patient reports being able to cope with his uncle's death and being to able to deal with it. Patient later on had another spell this afternoon in the cafeteria ,when he became very anxious and agitated ,needed prn medications haldol,bendaryl and ativan to calm himself down. Per staff patient had a phone conversation with family prior to this. Patient continues to be unpredictable.Patient denies any withdrawal sx. VS noted to be WNL. Will continue to monitor   Diagnosis:   DSM5:  Primary Psychiatric Diagnosis: MDD ,single episode , severe with psychosis   Secondary Psychiatric Diagnosis: Benzodiazepine (valium) use disorder, unspecified Bereavement,uncomplicated R/O Somatic symptom disorder with attacks or seizures   Non Psychiatric Diagnosis:   Total Time spent with patient: 45 minutes   ADL's:  Intact  Sleep: Fair  Appetite:  Fair   Psychiatric Specialty Exam: Physical Exam  ROS  Blood pressure 107/90, pulse 76, temperature 97.6 F (36.4 C), temperature source Oral, resp. rate 18, height _0  (1.88 m), weight 86.637 kg (191 lb), SpO2 100 %.Body mass index is 24.51 kg/(m^2).  General Appearance: Disheveled  Eye Sport and exercise psychologist::  Fair  Speech:  Normal Rate  Volume:  Normal  Mood:  Anxious  Affect:  Flat  Thought Process:  Coherent  Orientation:   Full (Time, Place, and Person)  Thought Content:  Paranoid Ideation  Suicidal Thoughts:  No  Homicidal Thoughts:  No  Memory:  Immediate;   Fair Recent;   Fair Remote;   Fair  Judgement:  Impaired  Insight:  Lacking  Psychomotor Activity:  Normal  Concentration:  Poor  Recall:  [employer] of Knowledge:Fair  Language: Fair  Akathisia:  No  Handed:  Right  AIMS (if indicated):     Assets:  Desire for Improvement  Sleep:  Number of Hours: 6.25   Musculoskeletal: Strength & Muscle Tone: within normal limits Gait & Station: normal Patient leans: N/A  Current Medications: Current Facility-Administered Medications  Medication Dose Route Frequency Provider Last Rate Last Dose  . acetaminophen (TYLENOL) tablet 650 mg  650 mg Oral Q6H PRN Laverle Hobby, PA-C      . albuterol (PROVENTIL HFA;VENTOLIN HFA) 108 (90 BASE) MCG/ACT inhaler 1-2 puff  1-2 puff Inhalation Q6H PRN Laverle Hobby, PA-C      . alum & mag hydroxide-simeth (MAALOX/MYLANTA) 200-200-20 MG/5ML suspension 30 mL  30 mL Oral Q4H PRN Laverle Hobby, PA-C      . risperiDONE (RISPERDAL M-TABS) disintegrating tablet 0.5 mg  0.5 mg Oral QHS Cyniah Gossard, MD       And  . benztropine (COGENTIN) tablet 0.5 mg  0.5 mg Oral QHS Ilia Engelbert, MD      . chlordiazePOXIDE (LIBRIUM) capsule 25 mg  25 mg Oral Q6H PRN Thimothy Barretta, MD      . chlordiazePOXIDE (LIBRIUM) capsule 25  mg  25 mg Oral TID Ursula Alert, MD   25 mg at 11/25/14 1142   Followed by  . [START ON 11/26/2014] chlordiazePOXIDE (LIBRIUM) capsule 25 mg  25 mg Oral BH-qamhs Ursula Alert, MD       Followed by  . [START ON 11/27/2014] chlordiazePOXIDE (LIBRIUM) capsule 25 mg  25 mg Oral Daily Borna Wessinger, MD      . diphenhydrAMINE (BENADRYL) 50 MG/ML injection           . FLUoxetine (PROZAC) capsule 20 mg  20 mg Oral Daily Nithya Meriweather, MD   20 mg at 11/25/14 0959  . haloperidol lactate (HALDOL) 5 MG/ML injection           . hydrOXYzine (ATARAX/VISTARIL) tablet  25 mg  25 mg Oral Q6H PRN Ursula Alert, MD      . loperamide (IMODIUM) capsule 2-4 mg  2-4 mg Oral PRN Ursula Alert, MD      . magnesium hydroxide (MILK OF MAGNESIA) suspension 30 mL  30 mL Oral Daily PRN Laverle Hobby, PA-C      . multivitamin with minerals tablet 1 tablet  1 tablet Oral Daily Ursula Alert, MD   1 tablet at 11/25/14 0816  . naproxen (NAPROSYN) tablet 500 mg  500 mg Oral BID PRN Laverle Hobby, PA-C   500 mg at 11/24/14 2118  . nicotine polacrilex (NICORETTE) gum 2 mg  2 mg Oral PRN Laverle Hobby, PA-C   2 mg at 11/25/14 0959  . OLANZapine zydis (ZYPREXA) disintegrating tablet 5 mg  5 mg Oral Q6H PRN Ursula Alert, MD       Or  . OLANZapine (ZYPREXA) injection 5 mg  5 mg Intramuscular Q6H PRN Islam Villescas, MD      . ondansetron (ZOFRAN-ODT) disintegrating tablet 4 mg  4 mg Oral Q6H PRN Kelsen Celona, MD      . thiamine (B-1) injection 100 mg  100 mg Intramuscular Once Ursula Alert, MD   100 mg at 11/24/14 0945  . thiamine (VITAMIN B-1) tablet 100 mg  100 mg Oral Daily Ursula Alert, MD   100 mg at 11/25/14 0816  . traZODone (DESYREL) tablet 50 mg  50 mg Oral QHS,MR X 1 Laverle Hobby, PA-C   50 mg at 11/24/14 2119    Lab Results:  Results for orders placed or performed during the hospital encounter of 11/23/14 (from the past 48 hour(s))  Urinalysis, Routine w reflex microscopic     Status: None   Collection Time: 11/24/14  2:27 PM  Result Value Ref Range   Color, Urine YELLOW YELLOW   APPearance CLEAR CLEAR   Specific Gravity, Urine 1.012 1.005 - 1.030   pH 6.0 5.0 - 8.0   Glucose, UA NEGATIVE NEGATIVE mg/dL   Hgb urine dipstick NEGATIVE NEGATIVE   Bilirubin Urine NEGATIVE NEGATIVE   Ketones, ur NEGATIVE NEGATIVE mg/dL   Protein, ur NEGATIVE NEGATIVE mg/dL   Urobilinogen, UA 0.2 0.0 - 1.0 mg/dL   Nitrite NEGATIVE NEGATIVE   Leukocytes, UA NEGATIVE NEGATIVE    Comment: MICROSCOPIC NOT DONE ON URINES WITH NEGATIVE PROTEIN, BLOOD, LEUKOCYTES, NITRITE,  OR GLUCOSE <1000 mg/dL.  CBC with Differential     Status: None   Collection Time: 11/24/14  2:55 PM  Result Value Ref Range   WBC 5.3 4.0 - 10.5 K/uL   RBC 4.64 4.22 - 5.81 MIL/uL   Hemoglobin 13.6 13.0 - 17.0 g/dL   HCT 41.6 39.0 - 52.0 %  MCV 89.7 78.0 - 100.0 fL   MCH 29.3 26.0 - 34.0 pg   MCHC 32.7 30.0 - 36.0 g/dL   RDW 14.0 11.5 - 15.5 %   Platelets 165 150 - 400 K/uL   Neutrophils Relative % 53 43 - 77 %   Neutro Abs 2.8 1.7 - 7.7 K/uL   Lymphocytes Relative 36 12 - 46 %   Lymphs Abs 1.9 0.7 - 4.0 K/uL   Monocytes Relative 9 3 - 12 %   Monocytes Absolute 0.5 0.1 - 1.0 K/uL   Eosinophils Relative 2 0 - 5 %   Eosinophils Absolute 0.1 0.0 - 0.7 K/uL   Basophils Relative 0 0 - 1 %   Basophils Absolute 0.0 0.0 - 0.1 K/uL  Comprehensive metabolic panel     Status: Abnormal   Collection Time: 11/24/14  2:55 PM  Result Value Ref Range   Sodium 141 135 - 145 mmol/L    Comment: Please note change in reference range.   Potassium 4.3 3.5 - 5.1 mmol/L    Comment: Please note change in reference range.   Chloride 108 96 - 112 mEq/L   CO2 19 19 - 32 mmol/L   Glucose, Bld 96 70 - 99 mg/dL   BUN 8 6 - 23 mg/dL   Creatinine, Ser 1.07 0.50 - 1.35 mg/dL   Calcium 8.7 8.4 - 10.5 mg/dL   Total Protein 5.1 (L) 6.0 - 8.3 g/dL   Albumin 3.2 (L) 3.5 - 5.2 g/dL   AST 33 0 - 37 U/L   ALT 20 0 - 53 U/L   Alkaline Phosphatase 42 39 - 117 U/L   Total Bilirubin 0.4 0.3 - 1.2 mg/dL   GFR calc non Af Amer >90 >90 mL/min   GFR calc Af Amer >90 >90 mL/min    Comment: (NOTE) The eGFR has been calculated using the CKD EPI equation. This calculation has not been validated in all clinical situations. eGFR's persistently <90 mL/min signify possible Chronic Kidney Disease.    Anion gap 14 5 - 15    Physical Findings: AIMS: Facial and Oral Movements Muscles of Facial Expression: None, normal Lips and Perioral Area: None, normal Jaw: None, normal Tongue: None, normal,Extremity Movements Upper  (arms, wrists, hands, fingers): None, normal Lower (legs, knees, ankles, toes): None, normal, Trunk Movements Neck, shoulders, hips: None, normal, Overall Severity Severity of abnormal movements (highest score from questions above): None, normal Incapacitation due to abnormal movements: None, normal Patient's awareness of abnormal movements (rate only patient's report): No Awareness, Dental Status Current problems with teeth and/or dentures?: No Does patient usually wear dentures?: No  CIWA:  CIWA-Ar Total: 0 COWS:     Treatment Plan Summary: Daily contact with patient to assess and evaluate symptoms and progress in treatment Medication management  Plan: Will start Prozac 20 mg po daily for anxiety symptoms. Will start Risperdal 0.5 mg po qhs along with cogentin 0.5 mg po qhs for EPS. Will continue CIWA/librium protocol. Discussed with neurology regarding patient's recent spell - per neurology there is no pint in getting an EEG now ,it will not clarify anything. Patient can follow up with neurology on an outpatient basis. Will continue Trazodone 50 mg po qhs for sleep. CSW will work on disposition. Will limit telephone calls x 24 hrs if patient if this is not therapeutic for patient.    Medical Decision Making Problem Points:  Established problem, worsening (2), New problem, with no additional work-up planned (3), Review of last therapy  session (1) and Review of psycho-social stressors (1) Data Points:  Review or order clinical lab tests (1) Review and summation of old records (2) Review of medication regiment & side effects (2) Review of new medications or change in dosage (2)  I certify that inpatient services furnished can reasonably be expected to improve the patient's condition.   Adrijana Haros MD 11/25/2014, 12:53 PM

## 2014-11-25 NOTE — [facility] Group Notes (Signed)
[facility]  11/25/2014 , 1:39 PM    Type of Therapy:  [facility] Presentation  Participation Level:  Active  Participation Quality:  Attentive  Affect:  Blunted  Cognitive:  Oriented  Insight:  Limited  Engagement in Therapy:  Engaged  Modes of Intervention:  Discussion, Education and Socialization  Summary of Progress/Problems:  Onalee HuaDavid from [facility] [employer] came to present his recovery story and play the guitar.  Slept for the first part of group.  Left half way through and did not return.  Daryel Geraldorth, Laci Frenkel B 11/25/2014 , 1:39 PM

## 2014-11-26 MED ORDER — FLUOXETINE HCL 20 MG PO CAPS
40.0000 mg | ORAL_CAPSULE | Freq: Every day | ORAL | Status: DC
  Administered 2014-11-27: 40 mg via ORAL
  Filled 2014-11-26 (×2): qty 2

## 2014-11-26 NOTE — Plan of Care (Signed)
Problem: Aggression Towards others,Towards Self, and or Destruction Goal: STG-Patient will comply with prescribed medication regimen (Patient will comply with prescribed medication regimen)  Outcome: Progressing Pt complied with all ordered medications when offered, including PRN Zyprexa he requested and received c/o anxiety.

## 2014-11-26 NOTE — Progress Notes (Signed)
Nursing 1:1 note D:Pt observed sleeping in bed with eyes closed. RR even and unlabored. No distress noted. A: 1:1 observation continues for safety  R: pt remains safe  

## 2014-11-26 NOTE — [facility] Group Notes (Signed)
[facility] Group Notes:  (Nursing/MHT/Case Management/Adjunct)  Date:  11/26/2014  Time:  9:30am  Type of Therapy:  Nurse Education  Participation Level:  Did Not Attend  Participation Quality:    Affect:    Cognitive:    Insight:    Engagement in Group:    Modes of Intervention:    Summary of Progress/Problems: patient invited to group however remained in group with 1:1 staff.  Lawrence MarseillesFriedman, Ajiah Mcglinn Eakes 11/26/2014, 12:49 PM

## 2014-11-26 NOTE — [facility] Group Notes (Signed)
[facility] LCSW Group Therapy  11/26/2014 3:33 PM  Type of Therapy:  Group Therapy  Participation Level:  Active  Participation Quality:  Attentive  Affect:  Flat  Cognitive:  Alert and Oriented  Insight:  Improving  Engagement in Therapy:  Engaged  Modes of Intervention:  Confrontation, Discussion, Education, Exploration, Problem-solving, Rapport Building, Socialization and Support  Summary of Progress/Problems:  Finding Balance in Life. Today's group focused on defining balance in one's own words, identifying things that can knock one off balance, and exploring healthy ways to maintain balance in life. Group members were asked to provide an example of a time when they felt off balance, describe how they handled that situation,and process healthier ways to regain balance in the future. Group members were asked to share the most important tool for maintaining balance that they [facility] and how they plan to apply this method after discharge. Myles was attentive and engaged during today's processing group. He shared that he feels balanced today and is going with the flow. Pt reports that medication and talking to staff about his problems helped him to become calmer and less resistant to receiving help [facility]. He talked about his experience yesterday where a CIRT was called and shared that he apologized to those involved and learned and important lesson through that experience. He also offered advice to another group member about the importance of being around family and showing your love through your actions, not through gift giving.    Smart, Martrice Apt LCSWA  11/26/2014, 3:33 PM

## 2014-11-26 NOTE — Progress Notes (Signed)
D: Pt awake in milieu without visible distress. Observed watching tv with peers in dayroom. Verbally interactive with peers post lunch. Pt reported dizziness from his medication ("prozac") earlier this shift at approximately 1130. BP reading remains low.  Pt contracted for safety while hospitalized. Denied AH / VH when assessed.  A: Pt encouraged to stay in dayroom or lie down in bed for c/o dizziness to promote safety. Nursing staff continue  Q 15 minutes checks to promote safety as per MD's order. Fluids and food offered. All medications given as per order. Support and availability maintained.  R: Pt receptive to care. Tolerated  fluids wells in attempt to help improve BP readings.  No gestures / event of self injurious behavior to note at present.

## 2014-11-26 NOTE — Progress Notes (Addendum)
D: Pt presents with flat / sad affect and depressed mood this evening when approached. Observed sleeping in his bed intermittently during shift, respirations noted and was unlabored. Cooperative with vitals as scheduled.   A: Manual vitals done due to continued low BP results. Emotional support and availability offered. Order received for Spiritual Consult. Message left by writer for staff in that department to notify them of order. Q 15 minutes checks remains effective for SI and self injurious behavior as per order and pt is monitored as such without events.   R: Pt. receptive to care at this time. Contracts for safety when assessed. Denied AH / VH at this time. Remains safe  on / off unit without behavioral issues or gestures to harm self at present.

## 2014-11-26 NOTE — Progress Notes (Signed)
[facility]  11/26/2014 1:26 PM Garrett Carrillo  MRN:  [id number] Subjective: Patient states "I am feeling better.' Objective:Patient seen and chart reviewed.Patient seen in his room this AM. I have reviewed his care with staff. Patient today appears to be improving ,has better eye contact. Reports that he is trying to work on his 'acting out behavior ' today. Patient reports that yesterday he had this episode in the cafeteria since he felt bad after talking to his 24 year old child. Patient had restricted phone calls as well as visits yesterday after this event ,inorder to help him better. Patient today requests that he will behave and does not want these to be restricted ,hence will discontinue limiting phone calls and observe patient on the unit. Patient does report drowsiness from his medications. Discussed with patient regarding librium and combination of this with Prozac. Patient educated about the need for continuing Prozac and to change it to bedtime if he continues to feel drowsy even after Librium is stopped. Patient denied SI/HI/AH/VH. Patient denies any withdrawal sx. VS noted to be WNL. Will continue to monitor   Diagnosis:   DSM5:  Primary Psychiatric Diagnosis: MDD ,single episode , severe with psychosis   Secondary Psychiatric Diagnosis: Benzodiazepine (valium) use disorder, unspecified Bereavement,uncomplicated R/O Somatic symptom disorder with attacks or seizures (Patient to get EEG on and outpatient basis).   Non Psychiatric Diagnosis: R/O seizure disorder   Total Time spent with patient: 45 minutes   ADL's:  Intact  Sleep: Fair  Appetite:  Fair   Psychiatric Specialty Exam: Physical Exam  ROS  Blood pressure 105/55, pulse 100, temperature 97.9 F (36.6 C), temperature source Oral, resp. rate 17, height '6\' 2"'  (1.88 m), weight 86.637 kg (191 lb), SpO2 100 %.Body mass index is 24.51 kg/(m^2).  General Appearance: Fairly Groomed  Engineer, water::  Fair   Speech:  Normal Rate  Volume:  Normal  Mood:  Anxious improving  Affect:  congruent  Thought Process:  Coherent  Orientation:  Full (Time, Place, and Person)  Thought Content:  Rumination  Suicidal Thoughts:  No  Homicidal Thoughts:  No  Memory:  Immediate;   Fair Recent;   Fair Remote;   Fair  Judgement:  Impaired  Insight:  Shallow  Psychomotor Activity:  Normal  Concentration:  Poor  Recall:  [employer] of Knowledge:Fair  Language: Fair  Akathisia:  No  Handed:  Right  AIMS (if indicated):     Assets:  Desire for Improvement  Sleep:  Number of Hours: 6.5   Musculoskeletal: Strength & Muscle Tone: within normal limits Gait & Station: normal Patient leans: N/A  Current Medications: Current Facility-Administered Medications  Medication Dose Route Frequency Provider Last Rate Last Dose  . acetaminophen (TYLENOL) tablet 650 mg  650 mg Oral Q6H PRN Laverle Hobby, PA-C      . albuterol (PROVENTIL HFA;VENTOLIN HFA) 108 (90 BASE) MCG/ACT inhaler 1-2 puff  1-2 puff Inhalation Q6H PRN Laverle Hobby, PA-C      . alum & mag hydroxide-simeth (MAALOX/MYLANTA) 200-200-20 MG/5ML suspension 30 mL  30 mL Oral Q4H PRN Laverle Hobby, PA-C      . risperiDONE (RISPERDAL M-TABS) disintegrating tablet 0.5 mg  0.5 mg Oral QHS Kailea Dannemiller, MD   0.5 mg at 11/25/14 2132   And  . benztropine (COGENTIN) tablet 0.5 mg  0.5 mg Oral QHS Ursula Alert, MD   0.5 mg at 11/25/14 2132  . chlordiazePOXIDE (LIBRIUM) capsule 25 mg  25 mg Oral Q6H PRN Ursula Alert, MD   25 mg at 11/25/14 1738  . chlordiazePOXIDE (LIBRIUM) capsule 25 mg  25 mg Oral BH-qamhs Delaney Schnick, MD   25 mg at 11/26/14 0842   Followed by  . [START ON 11/27/2014] chlordiazePOXIDE (LIBRIUM) capsule 25 mg  25 mg Oral Daily Ursula Alert, MD      . Derrill Memo ON 11/27/2014] FLUoxetine (PROZAC) capsule 40 mg  40 mg Oral Daily Martice Doty, MD      . hydrOXYzine (ATARAX/VISTARIL) tablet 25 mg  25 mg Oral Q6H PRN Ursula Alert, MD    25 mg at 11/25/14 2249  . loperamide (IMODIUM) capsule 2-4 mg  2-4 mg Oral PRN Ursula Alert, MD      . magnesium hydroxide (MILK OF MAGNESIA) suspension 30 mL  30 mL Oral Daily PRN Laverle Hobby, PA-C      . multivitamin with minerals tablet 1 tablet  1 tablet Oral Daily Ursula Alert, MD   1 tablet at 11/26/14 0842  . naproxen (NAPROSYN) tablet 500 mg  500 mg Oral BID PRN Laverle Hobby, PA-C   500 mg at 11/25/14 2132  . nicotine polacrilex (NICORETTE) gum 2 mg  2 mg Oral PRN Laverle Hobby, PA-C   2 mg at 11/26/14 1313  . OLANZapine zydis (ZYPREXA) disintegrating tablet 5 mg  5 mg Oral Q6H PRN Ursula Alert, MD   5 mg at 11/25/14 1733   Or  . OLANZapine (ZYPREXA) injection 5 mg  5 mg Intramuscular Q6H PRN Breanah Faddis, MD      . ondansetron (ZOFRAN-ODT) disintegrating tablet 4 mg  4 mg Oral Q6H PRN Cortlynn Hollinsworth, MD      . thiamine (B-1) injection 100 mg  100 mg Intramuscular Once Ursula Alert, MD   100 mg at 11/24/14 0945  . thiamine (VITAMIN B-1) tablet 100 mg  100 mg Oral Daily Ursula Alert, MD   100 mg at 11/26/14 0842  . traZODone (DESYREL) tablet 50 mg  50 mg Oral QHS,MR X 1 Laverle Hobby, PA-C   50 mg at 11/25/14 2249    Lab Results:  Results for orders placed or performed during the hospital encounter of 11/23/14 (from the past 48 hour(s))  Urinalysis, Routine w reflex microscopic     Status: None   Collection Time: 11/24/14  2:27 PM  Result Value Ref Range   Color, Urine YELLOW YELLOW   APPearance CLEAR CLEAR   Specific Gravity, Urine 1.012 1.005 - 1.030   pH 6.0 5.0 - 8.0   Glucose, UA NEGATIVE NEGATIVE mg/dL   Hgb urine dipstick NEGATIVE NEGATIVE   Bilirubin Urine NEGATIVE NEGATIVE   Ketones, ur NEGATIVE NEGATIVE mg/dL   Protein, ur NEGATIVE NEGATIVE mg/dL   Urobilinogen, UA 0.2 0.0 - 1.0 mg/dL   Nitrite NEGATIVE NEGATIVE   Leukocytes, UA NEGATIVE NEGATIVE    Comment: MICROSCOPIC NOT DONE ON URINES WITH NEGATIVE PROTEIN, BLOOD, LEUKOCYTES, NITRITE, OR  GLUCOSE <1000 mg/dL.  CBC with Differential     Status: None   Collection Time: 11/24/14  2:55 PM  Result Value Ref Range   WBC 5.3 4.0 - 10.5 K/uL   RBC 4.64 4.22 - 5.81 MIL/uL   Hemoglobin 13.6 13.0 - 17.0 g/dL   HCT 41.6 39.0 - 52.0 %   MCV 89.7 78.0 - 100.0 fL   MCH 29.3 26.0 - 34.0 pg   MCHC 32.7 30.0 - 36.0 g/dL   RDW 14.0 11.5 - 15.5 %  Platelets 165 150 - 400 K/uL   Neutrophils Relative % 53 43 - 77 %   Neutro Abs 2.8 1.7 - 7.7 K/uL   Lymphocytes Relative 36 12 - 46 %   Lymphs Abs 1.9 0.7 - 4.0 K/uL   Monocytes Relative 9 3 - 12 %   Monocytes Absolute 0.5 0.1 - 1.0 K/uL   Eosinophils Relative 2 0 - 5 %   Eosinophils Absolute 0.1 0.0 - 0.7 K/uL   Basophils Relative 0 0 - 1 %   Basophils Absolute 0.0 0.0 - 0.1 K/uL  Comprehensive metabolic panel     Status: Abnormal   Collection Time: 11/24/14  2:55 PM  Result Value Ref Range   Sodium 141 135 - 145 mmol/L    Comment: Please note change in reference range.   Potassium 4.3 3.5 - 5.1 mmol/L    Comment: Please note change in reference range.   Chloride 108 96 - 112 mEq/L   CO2 19 19 - 32 mmol/L   Glucose, Bld 96 70 - 99 mg/dL   BUN 8 6 - 23 mg/dL   Creatinine, Ser 1.07 0.50 - 1.35 mg/dL   Calcium 8.7 8.4 - 10.5 mg/dL   Total Protein 5.1 (L) 6.0 - 8.3 g/dL   Albumin 3.2 (L) 3.5 - 5.2 g/dL   AST 33 0 - 37 U/L   ALT 20 0 - 53 U/L   Alkaline Phosphatase 42 39 - 117 U/L   Total Bilirubin 0.4 0.3 - 1.2 mg/dL   GFR calc non Af Amer >90 >90 mL/min   GFR calc Af Amer >90 >90 mL/min    Comment: (NOTE) The eGFR has been calculated using the CKD EPI equation. This calculation has not been validated in all clinical situations. eGFR's persistently <90 mL/min signify possible Chronic Kidney Disease.    Anion gap 14 5 - 15    Physical Findings: AIMS: Facial and Oral Movements Muscles of Facial Expression: None, normal Lips and Perioral Area: None, normal Jaw: None, normal Tongue: None, normal,Extremity Movements Upper  (arms, wrists, hands, fingers): None, normal Lower (legs, knees, ankles, toes): None, normal, Trunk Movements Neck, shoulders, hips: None, normal, Overall Severity Severity of abnormal movements (highest score from questions above): None, normal Incapacitation due to abnormal movements: None, normal Patient's awareness of abnormal movements (rate only patient's report): No Awareness, Dental Status Current problems with teeth and/or dentures?: No Does patient usually wear dentures?: No  CIWA:  CIWA-Ar Total: 3 COWS:     Treatment Plan Summary: Daily contact with patient to assess and evaluate symptoms and progress in treatment Medication management  Plan: Will increase Prozac to 40 mg po daily for anxiety symptoms. Will continue Risperdal 0.5 mg po qhs along with cogentin 0.5 mg po qhs for EPS. Will continue CIWA/librium protocol. Discussed with neurology regarding patient's recent spell - per neurology there is no point in getting an EEG now ,it will not clarify anything. Patient can follow up with neurology on an outpatient basis. Will continue Trazodone 50 mg po qhs for sleep. CSW will work on disposition.    Medical Decision Making Problem Points:  Established problem, stable/improving (1), Review of last therapy session (1) and Review of psycho-social stressors (1) Data Points:  Review or order clinical lab tests (1) Review and summation of old records (2) Review of medication regiment & side effects (2) Review of new medications or change in dosage (2)  I certify that inpatient services furnished can reasonably be expected to improve  the patient's condition.   Emeree Mahler MD 11/26/2014, 1:26 PM

## 2014-11-26 NOTE — Progress Notes (Signed)
D: Pt alert, denies pain, SI /HI when assessed this evening. Escorted to cafeteria for supper with peers, returned to unit without issues.  A: CIWA assessment completed as per order at 1800. Pt encouraged to come to staff with thoughts of SI and when lightheaded to promote safety. Emotional support and availability offered. Q 15 minutes checks remains effective for SI and self injurious behavior and pt monitored as such without event at this time.   R: Receptive to care. No behavioral outburst to note at present. Contracts for safety. No gestures / event of self injurious behavior to note at this time.

## 2014-11-26 NOTE — Progress Notes (Signed)
D: Pt continues on 1:1 observation level at present for self injurious behavior and SI with assigned staff in attendance at all times as per order. Mr. Norina BuzzardDorian presents with sad /flat affect and depressed mood when approached for assessment this AM. Verbally contracted for safety while hospitalized. Denied pain, AH / VH at time of assessment. Alert and oriented X 4. Reported sleeping ok last night. A: All medications given as ordered. Emotional support and availability offered. Safety maintained on 1:1 observation level as per order. R: Pt receptive to care. Cooperative with current observation level. Made no gestures to harm self thus far this shift. Remains safe on unit.

## 2014-11-26 NOTE — Progress Notes (Signed)
D: Pt visible in scheduled group 1330 with peers. Verbally participated & was interactive with peers and staff during group session.  Presents with flat affect and depressed mood but is cooperative with care at this time.  A: Pt was given PRN Zyprexa 5mg  PO at 1350 for c/o anxiety related to discussion in noon group in reference to his CIRT on 11-25-14. Pt reassessed at 1450. Nursing staff continues to monitor pt on Q 15 minutes checks as per order to promote safety.  R: Pt reported relief from anxiety when assessed at 1450 post PRN Zyprexa administration. Continues to voice his concerns well without behavioral outburst. Verbally contracts for safety while hospitalized. Remains safe on / off unit without gestures of self injurious behavior to note at present.

## 2014-11-27 DIAGNOSIS — F1390 Sedative, hypnotic, or anxiolytic use, unspecified, uncomplicated: Secondary | ICD-10-CM

## 2014-11-27 MED ORDER — FLUOXETINE HCL 20 MG PO CAPS
20.0000 mg | ORAL_CAPSULE | Freq: Every day | ORAL | Status: DC
  Administered 2014-11-28: 20 mg via ORAL
  Filled 2014-11-27 (×2): qty 1
  Filled 2014-11-27: qty 14

## 2014-11-27 NOTE — Progress Notes (Addendum)
[facility] MD Progress Note  11/27/2014 11:31 AM Garrett Carrillo  MRN:  [id number] Subjective: Patient states "I am feel dizzy." Objective:Patient seen and chart reviewed.Patient seen in his room this AM. I have reviewed his care with staff. Patient today appears to be improving regarding his affective symptoms ,however reports feeling dizzy. Discussed with patient that it could be the combination of medications that he is on that could be making him feel this way. Possibly Librium. Patient encouraged to stay in bed ,if he feels dizzy ,and to ask for help. Patient denies SI/HI/AH/VH. Patient eager to return home and be with family. Denies any current intrusive thoughts about the traumatic death of his uncle. Patient denies any withdrawal sx. VS noted to be WNL. Will continue to monitor. Patient provided with wheelchair ,per staff.  Per collateral noted in chart per Mr.eric Arlyce Dice ,patient recently coached by his biological mother on how to 'act crazy". Unknown if his current presentation is 2/2 to that.    Diagnosis:   DSM5:  Primary Psychiatric Diagnosis: MDD ,single episode , severe with psychosis   Secondary Psychiatric Diagnosis: Benzodiazepine (valium) use disorder, unspecified Bereavement,uncomplicated R/O Somatic symptom disorder with attacks or seizures (Patient to get EEG on and outpatient basis).   Non Psychiatric Diagnosis: R/O seizure disorder   Total Time spent with patient: 45 minutes   ADL's:  Intact  Sleep: Fair  Appetite:  Fair   Psychiatric Specialty Exam: Physical Exam  ROS  Blood pressure 115/85, pulse 78, temperature 97.8 F (36.6 C), temperature source Oral, resp. rate 18, height  (1.88 m), weight 86.637 kg (191 lb), SpO2 100 %.Body mass index is 24.51 kg/(m^2).  General Appearance: Fairly Groomed  Patent attorney::  Fair  Speech:  Normal Rate  Volume:  Normal  Mood:  Anxious improving  Affect:  congruent  Thought Process:  Coherent  Orientation:   Full (Time, Place, and Person)  Thought Content:  Rumination  Suicidal Thoughts:  No  Homicidal Thoughts:  No  Memory:  Immediate;   Fair Recent;   Fair Remote;   Fair  Judgement:  Impaired  Insight:  Shallow  Psychomotor Activity:  Normal  Concentration:  Poor  Recall:  [employer] of Knowledge:Fair  Language: Fair  Akathisia:  No  Handed:  Right  AIMS (if indicated):   0  Assets:  Desire for Improvement  Sleep:  Number of Hours: 5.75   Musculoskeletal: Strength & Muscle Tone: within normal limits Gait & Station: normal Patient leans: N/A  Current Medications: Current Facility-Administered Medications  Medication Dose Route Frequency Provider Last Rate Last Dose  . acetaminophen (TYLENOL) tablet 650 mg  650 mg Oral Q6H PRN Kerry Hough, PA-C      . albuterol (PROVENTIL HFA;VENTOLIN HFA) 108 (90 BASE) MCG/ACT inhaler 1-2 puff  1-2 puff Inhalation Q6H PRN Kerry Hough, PA-C      . alum & mag hydroxide-simeth (MAALOX/MYLANTA) 200-200-20 MG/5ML suspension 30 mL  30 mL Oral Q4H PRN Kerry Hough, PA-C      . risperiDONE (RISPERDAL M-TABS) disintegrating tablet 0.5 mg  0.5 mg Oral QHS Neizan Debruhl, MD   0.5 mg at 11/26/14 2234   And  . benztropine (COGENTIN) tablet 0.5 mg  0.5 mg Oral QHS Karna Abed, MD   0.5 mg at 11/26/14 2235  . FLUoxetine (PROZAC) capsule 40 mg  40 mg Oral Daily Jomarie Longs, MD   40 mg at 11/27/14 0826  . magnesium hydroxide (MILK OF MAGNESIA) suspension  30 mL  30 mL Oral Daily PRN Kerry HoughSpencer E Simon, PA-C      . multivitamin with minerals tablet 1 tablet  1 tablet Oral Daily Jomarie LongsSaramma Aksh Swart, MD   1 tablet at 11/27/14 0826  . naproxen (NAPROSYN) tablet 500 mg  500 mg Oral BID PRN Kerry HoughSpencer E Simon, PA-C   500 mg at 11/25/14 2132  . nicotine polacrilex (NICORETTE) gum 2 mg  2 mg Oral PRN Kerry HoughSpencer E Simon, PA-C   2 mg at 11/27/14 [id number]  . OLANZapine zydis (ZYPREXA) disintegrating tablet 5 mg  5 mg Oral Q6H PRN Jomarie LongsSaramma Olamide Lahaie, MD   5 mg at 11/26/14 1350    Or  . OLANZapine (ZYPREXA) injection 5 mg  5 mg Intramuscular Q6H PRN Jomarie LongsSaramma Yaakov Saindon, MD      . thiamine (B-1) injection 100 mg  100 mg Intramuscular Once Jomarie LongsSaramma Mignonne Afonso, MD   100 mg at 11/24/14 0945  . thiamine (VITAMIN B-1) tablet 100 mg  100 mg Oral Daily Jomarie LongsSaramma Anice Wilshire, MD   100 mg at 11/27/14 0826  . traZODone (DESYREL) tablet 50 mg  50 mg Oral QHS,MR X 1 Kerry HoughSpencer E Simon, PA-C   50 mg at 11/26/14 2335    Lab Results:  No results found for this or any previous visit (from the past 48 hour(s)).  Physical Findings: AIMS: Facial and Oral Movements Muscles of Facial Expression: None, normal Lips and Perioral Area: None, normal Jaw: None, normal Tongue: None, normal,Extremity Movements Upper (arms, wrists, hands, fingers): None, normal Lower (legs, knees, ankles, toes): None, normal, Trunk Movements Neck, shoulders, hips: None, normal, Overall Severity Severity of abnormal movements (highest score from questions above): None, normal Incapacitation due to abnormal movements: None, normal Patient's awareness of abnormal movements (rate only patient's report): No Awareness, Dental Status Current problems with teeth and/or dentures?: No Does patient usually wear dentures?: No  CIWA:  CIWA-Ar Total: 0 COWS:     Treatment Plan Summary: Daily contact with patient to assess and evaluate symptoms and progress in treatment Medication management  Plan: Will reduce Prozac to 20 mg po daily 2/2 dizziness. Will continue Risperdal 0.5 mg po qhs along with cogentin 0.5 mg po qhs for EPS. AIMS - 0 (11/27/14) Will continue CIWA/librium protocol. Discussed with neurology regarding patient's recent spell - per neurology there is no point in getting an EEG now ,it will not clarify anything. Patient can follow up with neurology on an outpatient basis. Will continue Trazodone 50 mg po qhs for sleep. CSW will work on disposition. Patient to be discharged on Saturday - 11/28/2014 ,if he continues to be  stable.    Medical Decision Making Problem Points:  Established problem, stable/improving (1), Review of last therapy session (1) and Review of psycho-social stressors (1) Data Points:  Review and summation of old records (2) Review of medication regiment & side effects (2) Review of new medications or change in dosage (2)  I certify that inpatient services furnished can reasonably be expected to improve the patient's condition.   Ladale Sherburn MD 11/27/2014, 11:31 AM

## 2014-11-27 NOTE — Progress Notes (Signed)
[facility] Adult [facility] Management Discharge Plan :  Will you be returning to the same living situation after discharge: No, pt plans to stay with mother of his second child and then stay with his sister temporarily.  At discharge, do you have transportation home?:Yes,  bus pass in chart Do you have the ability to pay for your medications:Yes,  Medicaid  Release of information consent forms completed and submitted to Medical Records by CSW.  Patient to Follow up at: Follow-up Information    Follow up with [facility]    .   Why:  No appts available until Tuesday. Office requested that you call at 9:00AM on Monday 11/30/14 in order to schedule appt for primary care follow-up.    Contact information:   [address] [place] [phone]      Follow up with [facility].   Why:  Walk in between 8am-9am Monday through Friday for hospital follow-up/medication management/assessment for therapy services.    Contact information:   [address] [place], [place] [address] Phone: [phone] Fax: [phone]      Patient denies SI/HI:   Yes,  during group/self report.     Safety Planning and Suicide Prevention discussed:  Yes,  Contact attempts made with pt's foster mom and voicemail left requessting call back. SPE completed with pt and he was provided with SPI pamphlet and encouraged to share this infromation with his support network.   Patient refused referral  Smart, Lebron QuamHeather LCSWA  11/27/2014, 10:58 AM
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
## 2014-11-27 NOTE — Tx Team (Signed)
Interdisciplinary Treatment Plan Update (Adult)   Date: 11/27/2014  Time Reviewed:10:49 AM  Progress in Treatment:  Attending groups: Yes  Participating in groups: Yes  Taking medication as prescribed: Yes  Tolerating medication: Yes  Family/Significant othe contact made: Contact attempts made with pt's foster mother. SPE completed with pt.  Patient understands diagnosis: Yes, AEB seeking treatment for SI, mood instability, for medication stabilization, and xanax abuse.  Discussing patient identified problems/goals with staff: Yes  Medical problems stabilized or resolved: Yes  Denies suicidal/homicidal ideation: Passive SI/able to contract for safety.  Patient has not harmed self or Others: Yes  New problem(s) identified:  Discharge Plan or Barriers:  Pt reports that he will stay with mother of child and with his sister temporarily until his foster mother can find apt for [place]. He will [facility]. Pt provided with numerous resources including mental health association, mobile crisis, and [facility] warm line.  Additional comments:  Garrett Carrillo is an 24 y.o. male [facility] after being petitioned by [employer] due to contacting 911 verbalizing suicidal ideations. Pt denies SI at this time but reported that earlier today he had thought to harm himself. Pt stated "I received some bad news and it came out the wrong way". Pt did not report any previous suicide attempts or psychiatric hospitalizations. Pt denies HI and AVH at this time. Pt was given Haldol prior to assessment. Pt is drowsy and did not actively engage in the assessment. Pt should be re-assessment when he is alert and oriented.  Reason for Continuation of Hospitalization: Med management  Estimated length of stay: 1 day (d/c scheduled for Saturday per Dr. Elna BreslowEappen)  For review of initial/current patient goals, please see plan of care.  Attendees:  Patient:    Family:    Physician: Dr.  Elna BreslowEappen, MD 11/27/2014 10:49 AM   Nursing: Rebecka Apleyatrice, Troy RN 11/27/2014 10:49 AM   Clinical Social Worker Shawan Corella Smart, LCSWA  11/27/2014 10:49 AM   Other: Richelle Itood North, LCSW 11/27/2014 10:49 AM   Other: Darden DatesJennifer C. Nurse CM 11/27/2014 10:49 AM   Other: Liliane Badeolora Sutton, Community Care Coordinator  11/27/2014 10:49 AM   Other:    Scribe for Treatment Team:  The Sherwin-WilliamsHeather Smart LCSWA 11/27/2014 10:49 AM

## 2014-11-27 NOTE — [facility] Group Notes (Signed)
[facility]   11/27/2014 11:13 AM  Participation Quality:  Appropriate   Mood/Affect:  Flat  Depression Rating:  2  Anxiety Rating:  2  Thoughts of Suicide:  No Will you contract for safety?   NA  Current AVH:  No  Plan for Discharge/Comments:  Pt reports that he is d/cing tomorrow and plans to stay with mother of his child and with his sister until his foster mom secures him apt [place]. Pt plans to [facility] and Vikki PortsValerie will get appt for him prior to his d/c.   Transportation Means: bus pass in chart   Supports: foster parents, siblings  Counselling psychologistmart, OncologistHeather LCSWA
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
## 2014-11-27 NOTE — Progress Notes (Signed)
[facility] Group Notes:  (Nursing/MHT/Case Management/Adjunct)  Date:  11/27/2014  Time:  10:06 PM  Type of Therapy:  Psychoeducational Skills  Participation Level:  Active  Participation Quality:  Monopolizing  Affect:  Excited  Cognitive:  Lacking  Insight:  Lacking  Engagement in Group:  Off Topic and Resistant  Modes of Intervention:  Education  Summary of Progress/Problems: The patient described his day as having been "up and down". He states that he had a good day overall until he had a few bad telephone calls. His goal for tomorrow is to prepare himself for discharge. As a theme for the day, his coping skill is to go "jogging". He initially implied that he does things that are illegal as a coping skill but was redirected.   Garrett Carrillo S 11/27/2014, 10:06 PM

## 2014-11-27 NOTE — Progress Notes (Signed)
Pt presents with asymptomatic hypotension. Pt encouraged to increase fluid intake. Gatorade given. Pt also instructed to rise slowly when transitioning from sitting to standing.
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
## 2014-11-28 DIAGNOSIS — F323 Major depressive disorder, single episode, severe with psychotic features: Principal | ICD-10-CM

## 2014-11-28 MED ORDER — RISPERIDONE 0.5 MG PO TBDP: 0.5000 mg | ORAL_TABLET | Freq: Every day | ORAL | Status: DC

## 2014-11-28 MED ORDER — BENZTROPINE MESYLATE 0.5 MG PO TABS: 0.5000 mg | ORAL_TABLET | Freq: Every day | ORAL | Status: DC

## 2014-11-28 MED ORDER — TRAZODONE HCL 50 MG PO TABS: 50.0000 mg | ORAL_TABLET | Freq: Every evening | ORAL | Status: DC | PRN

## 2014-11-28 MED ORDER — FLUOXETINE HCL 20 MG PO CAPS
20.0000 mg | ORAL_CAPSULE | Freq: Every day | ORAL | Status: DC
Start: 2014-11-28 — End: 2015-03-25

## 2014-11-28 MED ORDER — NICOTINE POLACRILEX 2 MG MT GUM: 2.0000 mg | CHEWING_GUM | OROMUCOSAL | Status: DC | PRN

## 2014-11-28 MED ORDER — ALBUTEROL SULFATE HFA 108 (90 BASE) MCG/ACT IN AERS: 1.0000 | INHALATION_SPRAY | Freq: Four times a day (QID) | RESPIRATORY_TRACT | Status: DC | PRN

## 2014-11-28 MED ORDER — NAPROXEN 500 MG PO TABS: 500.0000 mg | ORAL_TABLET | Freq: Two times a day (BID) | ORAL | Status: DC | PRN

## 2014-11-28 NOTE — B[facility] Group Notes (Signed)
Adult Psychoeducational Group Note  Date:  11/28/2014 Time:  11:08 AM  Group Topic/Focus:  Healthy Coping Skills 0930 RN GROUP  Participation Level:  Active  Participation Quality:  Appropriate  Affect:  Appropriate  Cognitive:  Appropriate  Insight: Improving  Engagement in Group:  Engaged  Modes of Intervention:  Activity, Discussion and Education  Additional Comments:  Patient was able to verbalize some healthy coping skills  Barton Fannyyson, Iyesha Such M 11/28/2014, 11:08 AM
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
## 2014-11-28 NOTE — [facility] Suicide Risk Assessment (Signed)
   Demographic Factors:  Male, Adolescent or young adult and Unemployed  Total Time spent with patient: 20 minutes  Psychiatric Specialty Exam: Physical Exam  ROS  Blood pressure 108/60, pulse 111, temperature 98.6 F (37 C), temperature source Oral, resp. rate 20, height 6\' 2"  (1.88 m), weight 86.637 kg (191 lb), SpO2 100 %.Body mass index is 24.51 kg/(m^2).  General Appearance: Casual and Fairly Groomed  Patent attorneyye Contact::  Fair  Speech:  Clear and Coherent and Normal Rate  Volume:  Normal  Mood:  Euthymic  Affect:  Appropriate, Congruent and Constricted  Thought Process:  Coherent, Goal Directed and Logical  Orientation:  Full (Time, Place, and Person)  Thought Content:  Negative  Suicidal Thoughts:  No  Homicidal Thoughts:  No  Memory:  Negative  Judgement:  Fair  Insight:  Fair  Psychomotor Activity:  Normal  Concentration:  Fair  Recall:  FiservFair  Fund of Knowledge:Fair  Language: Fair  Akathisia:  Negative  Handed:  Right  AIMS (if indicated):     Assets:  Communication Skills Desire for Improvement Housing Resilience Social Support  Sleep:  Number of Hours: 5    Musculoskeletal: Strength & Muscle Tone: within normal limits Gait & Station: normal Patient leans: N/A   Mental Status Per Nursing Assessment::   On Admission:  Self-harm thoughts  Current Mental Status by Physician: Pt reports feeling better, less angry. Pt was able to discuss his problems in group. Pt was interactive and participating in groups today. Sleep/appetite good. Tolerating meds well. Pt denies SI/HI/AVH. Pt looking forward to celebrating his son's birthday today. He plans to pick up his son's bday cake and gift (electric drum set) after discharge from the hospital today.  Loss Factors: Loss of significant relationship  Historical Factors: Prior suicide attempts  Risk Reduction Factors:   Responsible for children under 24 years of age, Sense of responsibility to family, Living with  another person, especially a relative, Positive social support and Positive therapeutic relationship  Continued Clinical Symptoms:  Depression:   Hopelessness  Cognitive Features That Contribute To Risk:  Thought constriction (tunnel vision)    Suicide Risk:  Minimal: No identifiable suicidal ideation.  Patients presenting with no risk factors but with morbid ruminations; may be classified as minimal risk based on the severity of the depressive symptoms  Discharge Diagnoses:   AXIS I:  Major Depression, single episode AXIS II:  Deferred AXIS III:   Past Medical History  Diagnosis Date  . ADHD (attention deficit hyperactivity disorder)   . Suicide attempt    AXIS IV:  problems related to social environment AXIS V:  51-60 moderate symptoms  Plan Of Care/Follow-up recommendations:  Activity:  as tolerated Diet:  regular Tests:  per PCP Other:  [facility]  Is patient on multiple antipsychotic therapies at discharge:  No   Has Patient had three or more failed trials of antipsychotic monotherapy by history:  No  Recommended Plan for Multiple Antipsychotic Therapies: NA    Makylah Bossard 11/28/2014, 10:27 AM

## 2014-11-28 NOTE — Progress Notes (Signed)
Patient ID: Garrett SchoonerDorian L Karbowski, male   DOB: November 18, 1991, 24 y.o.   MRN: [id number] Patient discharge note:  Patient discharged home per MD order.  Patient received all personal belongings, prescriptions and medication samples.  [facility]  Patient denies any SI/HI/AVH.  Patient left ambulatory for the bus station.

## 2014-11-28 NOTE — Progress Notes (Signed)
The focus of this group is to educate the patient on the purpose and policies of crisis stabilization and provide a format to answer questions about their admission.  The group details unit policies and expectations of patients while admitted. This group was also time to discuss the patient self inventory at 1000 hrs. Patient attended this group.

## 2014-12-02 NOTE — Progress Notes (Signed)
Patient Discharge Instructions:  After Visit Summary (AVS):   Faxed to:  12/02/14 Discharge Summary Note:   Faxed to:  12/02/14 Psychiatric Admission Assessment Note:   Faxed to:  12/02/14 Suicide Risk Assessment - Discharge Assessment:   Faxed to:  12/02/14 Faxed/Sent to the Next Level Care provider:  12/02/14 Next Level Care Provider Has Access to the EMR, 12/02/14  Faxed to [facility] @ [phone] Records provided to [facility] via CHL/Epic access.   Jerelene ReddenSheena E Fairfield, 12/02/2014, 3:10 PM

## 2014-12-14 ENCOUNTER — Emergency Department (INDEPENDENT_AMBULATORY_CARE_PROVIDER_SITE_OTHER)
Admission: EM | Admit: 2014-12-14 | Discharge: 2014-12-14 | Disposition: A | Discharge status: Home / Self Care | Payer: Medicaid Other | Source: Home / Self Care | Attending: Family Medicine | Admitting: Family Medicine

## 2014-12-14 ENCOUNTER — Emergency Department (INDEPENDENT_AMBULATORY_CARE_PROVIDER_SITE_OTHER): Payer: Medicaid Other

## 2014-12-14 ENCOUNTER — Encounter (HOSPITAL_COMMUNITY): Payer: Self-pay | Admitting: *Deleted

## 2014-12-14 DIAGNOSIS — S6992XA Unspecified injury of left wrist, hand and finger(s), initial encounter: Secondary | ICD-10-CM | POA: Diagnosis not present

## 2014-12-14 MED ORDER — CLINDAMYCIN HCL 300 MG PO CAPS: 300.0000 mg | ORAL_CAPSULE | Freq: Three times a day (TID) | ORAL | Status: DC

## 2014-12-14 MED ORDER — TETANUS-DIPHTH-ACELL PERTUSSIS 5-2.5-18.5 LF-MCG/0.5 IM SUSP
INTRAMUSCULAR | Status: AC
  Filled 2014-12-14: qty 0.5

## 2014-12-14 MED ORDER — DICLOFENAC POTASSIUM 50 MG PO TABS: 50.0000 mg | ORAL_TABLET | Freq: Three times a day (TID) | ORAL | Status: DC

## 2014-12-14 MED ORDER — TETANUS-DIPHTH-ACELL PERTUSSIS 5-2.5-18.5 LF-MCG/0.5 IM SUSP
0.5000 mL | Freq: Once | INTRAMUSCULAR | Status: AC
  Administered 2014-12-14: 0.5 mL via INTRAMUSCULAR
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
## 2014-12-14 NOTE — Discharge Instructions (Signed)
Take all of antibiotic and soak in warm water when you take antibiotic, return on wed for recheck.

## 2014-12-16 ENCOUNTER — Encounter (HOSPITAL_COMMUNITY): Payer: Self-pay | Admitting: Emergency Medicine

## 2014-12-16 ENCOUNTER — Emergency Department (INDEPENDENT_AMBULATORY_CARE_PROVIDER_SITE_OTHER)
Admission: EM | Admit: 2014-12-16 | Discharge: 2014-12-16 | Disposition: A | Discharge status: Home / Self Care | Payer: Medicaid Other | Source: Home / Self Care | Attending: Family Medicine | Admitting: Family Medicine

## 2014-12-16 DIAGNOSIS — S6992XD Unspecified injury of left wrist, hand and finger(s), subsequent encounter: Secondary | ICD-10-CM
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
## 2014-12-16 NOTE — ED Notes (Signed)
Here for a follow on left hand; still having pain Taking and tolerating well meds  Alert, no signs of acute distress.

## 2014-12-16 NOTE — Discharge Instructions (Signed)
Finish medicine and continue soaking, return as needed.

## 2015-03-03 ENCOUNTER — Encounter (HOSPITAL_COMMUNITY): Payer: Self-pay | Admitting: *Deleted

## 2015-03-03 DIAGNOSIS — Z79899 Other long term (current) drug therapy: Secondary | ICD-10-CM | POA: Insufficient documentation

## 2015-03-03 DIAGNOSIS — Z720 Tobacco use: Secondary | ICD-10-CM | POA: Insufficient documentation

## 2015-03-03 DIAGNOSIS — J209 Acute bronchitis, unspecified: Secondary | ICD-10-CM | POA: Diagnosis not present

## 2015-03-03 DIAGNOSIS — Z880 Allergy status to penicillin: Secondary | ICD-10-CM | POA: Insufficient documentation

## 2015-03-03 DIAGNOSIS — Z8659 Personal history of other mental and behavioral disorders: Secondary | ICD-10-CM | POA: Diagnosis not present

## 2015-03-03 DIAGNOSIS — R05 Cough: Secondary | ICD-10-CM | POA: Diagnosis present

## 2015-03-03 NOTE — ED Notes (Signed)
Sinus congestion runny nose

## 2015-03-03 NOTE — ED Notes (Signed)
The pt is c/o a  Cold and cough since this am.  Dry cough chills and sweating.  headache

## 2015-03-04 ENCOUNTER — Emergency Department (HOSPITAL_COMMUNITY)
Admission: EM | Admit: 2015-03-04 | Discharge: 2015-03-04 | Disposition: A | Discharge status: Home / Self Care | Payer: Medicaid Other | Attending: Emergency Medicine | Admitting: Emergency Medicine

## 2015-03-04 ENCOUNTER — Emergency Department (HOSPITAL_COMMUNITY): Payer: Medicaid Other

## 2015-03-04 DIAGNOSIS — J209 Acute bronchitis, unspecified: Secondary | ICD-10-CM

## 2015-03-04 MED ORDER — IBUPROFEN 800 MG PO TABS
800.0000 mg | ORAL_TABLET | Freq: Once | ORAL | Status: AC
  Administered 2015-03-04: 800 mg via ORAL
  Filled 2015-03-04: qty 1

## 2015-03-04 MED ORDER — ALBUTEROL SULFATE HFA 108 (90 BASE) MCG/ACT IN AERS: 1.0000 | INHALATION_SPRAY | Freq: Four times a day (QID) | RESPIRATORY_TRACT | Status: DC | PRN

## 2015-03-04 MED ORDER — IBUPROFEN 800 MG PO TABS: 800.0000 mg | ORAL_TABLET | Freq: Three times a day (TID) | ORAL | Status: DC

## 2015-03-04 MED ORDER — BENZONATATE 100 MG PO CAPS: 100.0000 mg | ORAL_CAPSULE | Freq: Three times a day (TID) | ORAL | Status: DC

## 2015-03-04 MED ORDER — BENZONATATE 100 MG PO CAPS
200.0000 mg | ORAL_CAPSULE | Freq: Once | ORAL | Status: AC
  Administered 2015-03-04: 200 mg via ORAL
  Filled 2015-03-04: qty 2

## 2015-03-04 MED ORDER — IPRATROPIUM-ALBUTEROL 0.5-2.5 (3) MG/3ML IN SOLN
3.0000 mL | Freq: Once | RESPIRATORY_TRACT | Status: AC
  Administered 2015-03-04: 3 mL via RESPIRATORY_TRACT
  Filled 2015-03-04: qty 3

## 2015-03-04 NOTE — ED Provider Notes (Signed)
CSN: [id number]     Arrival date & time 03/03/15  2312 History  This chart was scribed for Arby BarretteMarcy Annalisia Ingber, MD by Evon Slackerrance Branch, ED Scribe. This patient was seen in room A12C/A12C and the patient's care was started at 1:43 AM.      Chief Complaint  Patient presents with  . Cough   The history is provided by the patient. No language interpreter was used.   HPI Comments: Garrett Carrillo is a 24 y.o. male who presents to the Emergency Department complaining of progressively worsening non productive cough onset 1 day ago. Pt states that the cough is so bad that it is bringing on chest pain and abdominal pain. Pt reports fever, sore throat, generalized myalgias and HA. Pt states that he has been around possible sick contacts. Pt denies any medications PTA. Pt states that he is an everyday smoker. Pt states that he smokes a few cigarettes daily. Denies vomiting or diarrhea.    Past Medical History  Diagnosis Date  . ADHD (attention deficit hyperactivity disorder)   . Suicide attempt    Past Surgical History  Procedure Laterality Date  . Thumb surgery Right 2009  . Eardrum repair Right    Family History  Problem Relation Age of Onset  . Adopted: Yes  . Hypertension Mother   . Hypertension Maternal Grandmother   . Hypertension Maternal Grandfather   . Hypertension Paternal Grandmother   . Hypertension Paternal Grandfather    History  Substance Use Topics  . Smoking status: Current Every Day Smoker -- 1.00 packs/day for 10 years    Types: Cigarettes  . Smokeless tobacco: Not on file  . Alcohol Use: Yes     Comment: occasional    Review of Systems A complete 10 system review of systems was obtained and all systems are negative except as noted in the HPI and PMH.     Allergies  Penicillins and Sulfur  Home Medications   Prior to Admission medications   Medication Sig Start Date End Date Taking? Authorizing Provider  albuterol (PROVENTIL HFA;VENTOLIN HFA) 108 (90 BASE)  MCG/ACT inhaler Inhale 1-2 puffs into the lungs every 6 (six) hours as needed for wheezing or shortness of breath. Patient not taking: Reported on 03/04/2015 11/28/14   Adonis BrookSheila Agustin, NP  albuterol (PROVENTIL HFA;VENTOLIN HFA) 108 (90 BASE) MCG/ACT inhaler Inhale 1-2 puffs into the lungs every 6 (six) hours as needed for wheezing or shortness of breath. 03/04/15   Arby BarretteMarcy Honey Zakarian, MD  benzonatate (TESSALON) 100 MG capsule Take 1 capsule (100 mg total) by mouth every 8 (eight) hours. 03/04/15   Arby BarretteMarcy Iris Hairston, MD  benztropine (COGENTIN) 0.5 MG tablet Take 1 tablet (0.5 mg total) by mouth at bedtime. Patient not taking: Reported on 03/04/2015 11/28/14   Adonis BrookSheila Agustin, NP  clindamycin (CLEOCIN) 300 MG capsule Take 1 capsule (300 mg total) by mouth 3 (three) times daily. Patient not taking: Reported on 03/04/2015 12/14/14   Linna HoffJames D Kindl, MD  diclofenac (CATAFLAM) 50 MG tablet Take 1 tablet (50 mg total) by mouth 3 (three) times daily. Patient not taking: Reported on 03/04/2015 12/14/14   Linna HoffJames D Kindl, MD  FLUoxetine (PROZAC) 20 MG capsule Take 1 capsule (20 mg total) by mouth daily. Patient not taking: Reported on 03/04/2015 11/28/14   Adonis BrookSheila Agustin, NP  ibuprofen (ADVIL,MOTRIN) 800 MG tablet Take 1 tablet (800 mg total) by mouth 3 (three) times daily. 03/04/15   Arby BarretteMarcy Clay Solum, MD  naproxen (NAPROSYN) 500 MG tablet Take 1 tablet (  500 mg total) by mouth 2 (two) times daily as needed for mild pain, moderate pain or headache. Patient not taking: Reported on 03/04/2015 11/28/14   Adonis Brook, NP  nicotine polacrilex (NICORETTE) 2 MG gum Take 1 each (2 mg total) by mouth as needed for smoking cessation (May have q 2h as needed). Patient not taking: Reported on 03/04/2015 11/28/14   Adonis Brook, NP  risperiDONE (RISPERDAL M-TABS) 0.5 MG disintegrating tablet Take 1 tablet (0.5 mg total) by mouth at bedtime. Patient not taking: Reported on 03/04/2015 11/28/14   Adonis Brook, NP  traZODone (DESYREL) 50 MG tablet Take 1  tablet (50 mg total) by mouth at bedtime and may repeat dose one time if needed. Patient not taking: Reported on 03/04/2015 11/28/14   Adonis Brook, NP   BP 112/47 mmHg  Pulse 96  Temp(Src) 99.4 F (37.4 C) (Oral)  Resp 16  Ht 6' (1.829 m)  Wt 200 lb (90.719 kg)  BMI 27.12 kg/m2  SpO2 96%   Physical Exam  Constitutional: He is oriented to person, place, and time. He appears well-developed and well-nourished. No distress.  HENT:  Head: Normocephalic and atraumatic.  Eyes: Conjunctivae and EOM are normal.  Neck: Neck supple. No tracheal deviation present.  Cardiovascular: Normal rate.   Pulmonary/Chest: Effort normal and breath sounds normal. No respiratory distress.  The patient has intermittent peroxisomal's of harsh cough. He however does not have any respiratory distress and his breath sounds are clear without rhonchi or rails.  Abdominal: Soft. Bowel sounds are normal. He exhibits no distension. There is no tenderness.  Musculoskeletal: Normal range of motion. He exhibits no edema or tenderness.  Neurological: He is alert and oriented to person, place, and time. He exhibits normal muscle tone. Coordination normal.  Skin: Skin is warm and dry.  Psychiatric: He has a normal mood and affect. His behavior is normal.  Nursing note and vitals reviewed.   ED Course  Procedures (including critical care time) DIAGNOSTIC STUDIES: Oxygen Saturation is 100% on RA, normal by my interpretation.    COORDINATION OF CARE: 2:11 AM-Discussed treatment plan with pt at bedside and pt agreed to plan.    Labs Review Labs Reviewed - No data to display  Imaging Review Dg Chest 2 View  03/04/2015   CLINICAL DATA:  Cough and fever since yesterday  EXAM: CHEST  2 VIEW  COMPARISON:  05/07/2014  FINDINGS: Normal heart size and mediastinal contours. No acute infiltrate or edema. No effusion or pneumothorax. No acute osseous findings.  IMPRESSION: No pneumonia.   Electronically Signed   By: Marnee Spring M.D.   On: 03/04/2015 03:45     EKG Interpretation None      MDM   Final diagnoses:  Acute bronchitis, unspecified organism   The patient presents with harsh cough paroxysms. These have been present only for approximately a day. The patient does have fever associated. Chest x-ray does not show any focal infiltrates or consolidations to suggest an acute pneumonia. Patient does not have toxic appearance or abnormal pulmonary examination. At this time I suspect symptoms are most likely viral in nature. This is possible influenza-like illness. The patient be treated with an inhaler, Tessalon Perles and ibuprofen for fever and body aches.     Arby Barrette, MD 03/04/15 [phone]

## 2015-03-04 NOTE — Discharge Instructions (Signed)
Acute Bronchitis °Bronchitis is inflammation of the airways that extend from the windpipe into the lungs (bronchi). The inflammation often causes mucus to develop. This leads to a cough, which is the most common symptom of bronchitis.  °In acute bronchitis, the condition usually develops suddenly and goes away over time, usually in a couple weeks. Smoking, allergies, and asthma can make bronchitis worse. Repeated episodes of bronchitis may cause further lung problems.  °CAUSES °Acute bronchitis is most often caused by the same virus that causes a cold. The virus can spread from person to person (contagious) through coughing, sneezing, and touching contaminated objects. °SIGNS AND SYMPTOMS  °· Cough.   °· Fever.   °· Coughing up mucus.   °· Body aches.   °· Chest congestion.   °· Chills.   °· Shortness of breath.   °· Sore throat.   °DIAGNOSIS  °Acute bronchitis is usually diagnosed through a physical exam. Your health care provider will also ask you questions about your medical history. Tests, such as chest X-rays, are sometimes done to rule out other conditions.  °TREATMENT  °Acute bronchitis usually goes away in a couple weeks. Oftentimes, no medical treatment is necessary. Medicines are sometimes given for relief of fever or cough. Antibiotic medicines are usually not needed but may be prescribed in certain situations. In some cases, an inhaler may be recommended to help reduce shortness of breath and control the cough. A cool mist vaporizer may also be used to help thin bronchial secretions and make it easier to clear the chest.  °HOME CARE INSTRUCTIONS °· Get plenty of rest.   °· Drink enough fluids to keep your urine clear or pale yellow (unless you have a medical condition that requires fluid restriction). Increasing fluids may help thin your respiratory secretions (sputum) and reduce chest congestion, and it will prevent dehydration.   °· Take medicines only as directed by your health care provider. °· If  you were prescribed an antibiotic medicine, finish it all even if you start to feel better. °· Avoid smoking and secondhand smoke. Exposure to cigarette smoke or irritating chemicals will make bronchitis worse. If you are a smoker, consider using nicotine gum or skin patches to help control withdrawal symptoms. Quitting smoking will help your lungs heal faster.   °· Reduce the chances of another bout of acute bronchitis by washing your hands frequently, avoiding people with cold symptoms, and trying not to touch your hands to your mouth, nose, or eyes.   °· Keep all follow-up visits as directed by your health care provider.   °SEEK MEDICAL CARE IF: °Your symptoms do not improve after 1 week of treatment.  °SEEK IMMEDIATE MEDICAL CARE IF: °· You develop an increased fever or chills.   °· You have chest pain.   °· You have severe shortness of breath. °· You have bloody sputum.   °· You develop dehydration. °· You faint or repeatedly feel like you are going to pass out. °· You develop repeated vomiting. °· You develop a severe headache. °MAKE SURE YOU:  °· Understand these instructions. °· Will watch your condition. °· Will get help right away if you are not doing well or get worse. °Document Released: 12/14/2004 Document Revised: 03/23/2014 Document Reviewed: 04/29/2013 °ExitCare® Patient Information ©2015 [employer]. This information is not intended to replace advice given to you by your health care provider. Make sure you discuss any questions you have with your health care provider. ° ° °Emergency Department Resource Guide °1) Find a Doctor and Pay Out of Pocket °Although you won't have to find out who is   covered by your insurance plan, it is a good idea to ask around and get recommendations. You will then need to call the office and see if the doctor you have chosen will accept you as a new patient and what types of options they offer for patients who are self-pay. Some doctors offer discounts or will set up  payment plans for their patients who do not have insurance, but you will need to ask so you aren't surprised when you get to your appointment. ° °2) Contact Your Local Health Department °Not all health departments have doctors that can see patients for sick visits, but many do, so it is worth a call to see if yours does. If you don't know where your local health department is, you can check in your phone book. The CDC also has a tool to help you locate your state's health department, and many state websites also have listings of all of their local health departments. ° °3) Find a Walk-in Clinic °If your illness is not likely to be very severe or complicated, you may want to try a walk in clinic. These are popping up all over the country in pharmacies, drugstores, and shopping centers. They're usually staffed by nurse practitioners or physician assistants that have been trained to treat common illnesses and complaints. They're usually fairly quick and inexpensive. However, if you have serious medical issues or chronic medical problems, these are probably not your best option. ° °No Primary Care Doctor: °- Call [facility] at  832-8000 - they can help you locate a primary care doctor that  accepts your insurance, provides certain services, etc. °- Physician Referral Service- 1-800-533-3463 ° °Chronic Pain Problems: °Organization         Address  Phone   Notes  °[facility]  (336) 297-2271 Patients need to be referred by their primary care doctor.  ° °Medication Assistance: °Organization         Address  Phone   Notes  °[place] [facility] [address] °[place], [place] [address] (336) 641-8030 --Must be a resident of [place] °-- Must have NO insurance coverage whatsoever (no Medicaid/ Medicare, etc.) °-- The pt. MUST have a primary care doctor that directs their care regularly and follows them in the community °  °[facility]  (866) 331-1348   °[employer]  (888) 892-1162   ° °Agencies that provide inexpensive medical care: °Organization         Address  Phone   Notes  °[facility]  (336) 832-8035   °[facility]    (336) 832-7272   °[facility] [address] °[place], [place] [address] (336) 832-4777   °[facility] [address], °[place] (336) 271-4999   °[facility]    (336) 373-0678   °[facility]    (336) 272-1050   °[facility] ° [address], [place] Phone:  (336) 832-4444, Fax:  (336) 832-4440 Hours of Operation:  9 am - 6 pm, M-F.  Also accepts Medicaid/Medicare and self-pay.  °[facility] ° [address], [place] Phone: (336) 832-3150, Fax: (336) [phone]. Hours of Operation:  8:30 am - 5:30 pm, M-F.  Also accepts Medicaid and self-pay.  °[facility] [place] [address], [place] Phone: (336) 878-6027   °[facility] [address], [place], [place] (336)723-1848, Ext.   123 Mondays & Thursdays: 7-9 AM.  First 15 patients are seen on a first come, first serve basis. °  ° °Medicaid-accepting [place] Providers: ° °Organization         Address  Phone   Notes  °[facility] [address], [place] (336) 641-2100 Also accepts self-pay patients.  °[facility] [address], [place] ° (336) 856-9996   °[facility] [address], [place] (336) 288-8857   °[facility] [address] [place] [address], [place] (336) 299-7000   °Veita Bland [address], [place]  ° (336) 373-1557 Only accepts [place] Access Medicaid patients after they have their name applied to their card.  ° °Self-Pay (no insurance) in [place]: ° °Organization         Address  Phone   Notes  °Sickle Cell Patients, [facility] [address], [place]  (336) 832-1970   °[facility] [address], [place] (336) 832-4400   °[facility] ° [address], [place] (336) 992-4800   °[facility]/Dr. Osei-Bonsu ° [address] [place] [address], [place] or [address], [place] (336) 841-8500 Phone number for both [place] and [place] locations is the same.  °[facility] [address], [place] (336) 299-0000   °[facility] [address] [place] [address], [place] or [address] (336) 852-7530 °(336) 878-2260   °[facility] [address], [place] (336) 350-1642, phone; (336) 294-5005, fax Sees patients 1st and 3rd Saturday of every month.  Must not qualify for public or private insurance (i.e. Medicaid, Medicare, Home Health Choice, Veterans' Benefits) • Household income should be no more than 200% of the poverty level •The clinic cannot treat you if you are pregnant or think you are pregnant • Sexually transmitted diseases are not treated at the clinic.  ° ° °Dental Care: °Organization         Address  Phone  Notes  °[facility] [address],  (336) 641-6152 Accepts children up to age 21 who are enrolled in Medicaid or [place] Health Choice; pregnant women with a Medicaid card; and children who have applied for Medicaid or [place] Health Choice, but were declined, whose parents can pay a reduced fee at time of service.  °[facility] [place]  [address], [place] (336) 641-7733 Accepts children up to age 21 who are enrolled in Medicaid or [place] Health Choice; pregnant women with a Medicaid card; and children who have applied for Medicaid or [place] Health Choice, but were declined, whose parents can pay a reduced fee at time of service.  °[facility] ° [address],  (336) 641-4533 Patients  are seen by appointment only. Walk-ins are not accepted. [facility] will see patients 18 years of age and older. °Monday - Tuesday (8am-5pm) °Most Wednesdays (8:30-5pm) °$30 per visit, cash only  °[facility] ° [address], [place] (336) 641-4533 Patients are seen by appointment only. Walk-ins are not accepted. [facility] will see patients 18 years of age and older. °One Wednesday Evening (Monthly: Volunteer Based).  $30 per visit, cash only  °[facility]  (919) [phone] for   adults; Children under age 4, call Graduate Pediatric Dentistry at ([phone]) [phone]. Children aged 4-14, please call (919) [phone] to request a pediatric application. ° Dental services are provided in all areas of dental care including fillings, crowns and bridges, complete and partial dentures, implants, gum treatment, root canals, and extractions. Preventive care is also provided. Treatment is provided to both adults and children. °Patients are selected via a lottery and there is often a waiting list. °  °[facility] [address], °[place] ° (336) 763-8833 [email or web address] °  °[facility] [address], [place], [place] (336)723-1848, Ext. 123 Second and Fourth Thursday of each month, opens at 6:30 AM; Clinic ends at 9 AM.  Patients are seen on a first-come first-served basis, and a limited number are seen during each clinic.  ° °[facility] ° [address], [place], [place] (336) 723-7904   Eligibility Requirements °You must have lived in [place], [place], or [place] counties for at least the last three months. °  You cannot be eligible for state or federal sponsored healthcare insurance, including Veterans Administration, Medicaid, or Medicare. °  You generally cannot be eligible for healthcare insurance through your employer.  °  How to apply: °Eligibility screenings are held every Tuesday and Wednesday afternoon from 1:00 pm until  4:00 pm. You do not need an appointment for the interview!  °[facility] [address], [place], [place] 336-631-2330   °[facility]  336-342-8273   °[facility]  336-703-3100   °[facility]  336-570-6415   ° °Behavioral Health Resources in the Community: °Intensive Outpatient Programs °Organization         Address  Phone  Notes  °[facility] [address], [place], [place] 336-878-6098   °[facility] [address], [place], [place] 336-832-9800   °[facility] [address], [place], [place] ° 336-882-2125   °[facility] [address],  °[place], [place] 1-800-853-5163 or 336-641-4981   °Substance Abuse Resources °Organization         Address  Phone  Notes  °[facility]  336-882-2125   °[facility]  336-784-9470   °[facility]  336-285-9073   °[facility]  336-845-3988   °Residential & Outpatient Substance Abuse Program  1-800-659-3381   °Psychological Services °Organization         Address  Phone  Notes  °[facility]  336- 832-9600   °[facility]  336- 378-7881   °[facility] [address], [place] 1-800-853-5163 or 336-641-4981   ° °Mobile Crisis Teams °Organization         Address  Phone  Notes  °[facility], Mobile Crisis Care Unit  1-877-626-1772   °Assertive °[facility] ° [address] [place], [place] 336-834-9664   °Sharon DeEsch [address] °[place] 336-554-5454   ° °Self-Help/Support Groups °Organization         Address  Phone             Notes  °[facility] - variety of support groups  336- 373-1402 Call for more information  °Narcotics Anonymous (NA), [facility] [address], °[place]  2 meetings at this location  ° °Residential Treatment Programs °Organization          Address  Phone  Notes  °[facility] [address],    °[place]  1-866-801-8205   °[facility] ° [address], [place], [place] 704-293-8524   °[facility] [address], [place] 336-845-3988 Admissions: 8am-3pm M-F  °[facility] [address]    °[place], [place] 336-841-1104   °[facility] [address], [place], [place] 336-379-7146   °[facility] [address]  °[place], [place] 336-285-9073   °[facility] - Intensive Outpatient [address], [place], [place] 336-852-3033   °[facility] [address]  °[place], [place] 1-877-615-2722 or 336-784-9470   °[facility] [address] [place], [place] 336-227-7417 Accepts Medicaid  °[facility] [address]  °[place] 1-800-659-3381 Substance Abuse/Addiction Treatment  ° °[place] Behavioral Health Resources °Organization         Address  Phone  Notes  °[facility]  (888) 581-9988   °Julie Brannon, PhD [address] [place], [place]   (336) 349-5553 or (336) 951-0000   °[facility]   [address] °[place], [place] (336) 349-4454   °[facility] [address], [place], [place] (336) 342-8316 Insurance/Medicaid/sponsorship through [facility]  °[facility] [address]                                    [place], [place] (336) 342-8316 Therapy/tele-psych/case  °[facility] [address]  ° [place], [place] (336) 349-2233    °Dr. Arfeen  (336) 349-4544   °[facility] [place]  [employer] [place] [facility] 1) [address], [place] °2) [address], [place] °3)  [address], [place] (336) 349-3220 °(336) 342-7768 ° °(336) 342-8140   °[place] [facility] (336) 342-1394 or (336) 342-3537 (After Hours)    ° ° ° °

## 2015-03-04 NOTE — ED Notes (Signed)
Pt transported to xray 

## 2015-03-21 ENCOUNTER — Other Ambulatory Visit: Payer: Self-pay

## 2015-03-21 ENCOUNTER — Emergency Department (HOSPITAL_COMMUNITY)
Admission: EM | Admit: 2015-03-21 | Discharge: 2015-03-22 | Disposition: A | Discharge status: Other Acute Inpatient Hospital | Payer: Medicaid Other | Attending: Emergency Medicine | Admitting: Emergency Medicine

## 2015-03-21 ENCOUNTER — Encounter (HOSPITAL_COMMUNITY): Payer: Self-pay | Admitting: *Deleted

## 2015-03-21 DIAGNOSIS — Z79899 Other long term (current) drug therapy: Secondary | ICD-10-CM | POA: Diagnosis not present

## 2015-03-21 DIAGNOSIS — F1210 Cannabis abuse, uncomplicated: Secondary | ICD-10-CM | POA: Insufficient documentation

## 2015-03-21 DIAGNOSIS — Y9289 Other specified places as the place of occurrence of the external cause: Secondary | ICD-10-CM | POA: Insufficient documentation

## 2015-03-21 DIAGNOSIS — Y998 Other external cause status: Secondary | ICD-10-CM | POA: Diagnosis not present

## 2015-03-21 DIAGNOSIS — T424X4A Poisoning by benzodiazepines, undetermined, initial encounter: Secondary | ICD-10-CM | POA: Insufficient documentation

## 2015-03-21 DIAGNOSIS — Z720 Tobacco use: Secondary | ICD-10-CM | POA: Insufficient documentation

## 2015-03-21 DIAGNOSIS — Z791 Long term (current) use of non-steroidal anti-inflammatories (NSAID): Secondary | ICD-10-CM | POA: Insufficient documentation

## 2015-03-21 DIAGNOSIS — Y9389 Activity, other specified: Secondary | ICD-10-CM | POA: Diagnosis not present

## 2015-03-21 DIAGNOSIS — R4182 Altered mental status, unspecified: Secondary | ICD-10-CM | POA: Diagnosis present

## 2015-03-21 DIAGNOSIS — Z792 Long term (current) use of antibiotics: Secondary | ICD-10-CM | POA: Insufficient documentation

## 2015-03-21 DIAGNOSIS — Z880 Allergy status to penicillin: Secondary | ICD-10-CM | POA: Diagnosis not present

## 2015-03-21 DIAGNOSIS — F1310 Sedative, hypnotic or anxiolytic abuse, uncomplicated: Secondary | ICD-10-CM | POA: Diagnosis not present

## 2015-03-21 DIAGNOSIS — F4323 Adjustment disorder with mixed anxiety and depressed mood: Secondary | ICD-10-CM | POA: Diagnosis not present

## 2015-03-21 LAB — RAPID URINE DRUG SCREEN, HOSP PERFORMED
Amphetamines: NOT DETECTED
Barbiturates: NOT DETECTED
Benzodiazepines: POSITIVE — AB
COCAINE: NOT DETECTED
OPIATES: NOT DETECTED
TETRAHYDROCANNABINOL: POSITIVE — AB

## 2015-03-21 LAB — COMPREHENSIVE METABOLIC PANEL
ALK PHOS: 49 U/L (ref 38–126)
ALT: 21 U/L (ref 17–63)
ANION GAP: 5 (ref 5–15)
AST: 28 U/L (ref 15–41)
Albumin: 3.8 g/dL (ref 3.5–5.0)
BILIRUBIN TOTAL: 0.6 mg/dL (ref 0.3–1.2)
BUN: 13 mg/dL (ref 6–20)
CO2: 28 mmol/L (ref 22–32)
Calcium: 8.8 mg/dL — ABNORMAL LOW (ref 8.9–10.3)
Chloride: 108 mmol/L (ref 101–111)
Creatinine, Ser: 1.1 mg/dL (ref 0.61–1.24)
GFR calc non Af Amer: >60 mL/min (ref >60)
Glucose, Bld: 80 mg/dL (ref 70–99)
Potassium: 3.5 mmol/L (ref 3.5–5.1)
SODIUM: 141 mmol/L (ref 135–145)
Total Protein: 6.2 g/dL — ABNORMAL LOW (ref 6.5–8.1)

## 2015-03-21 LAB — CBC WITH DIFFERENTIAL/PLATELET
Basophils Absolute: 0 10*3/uL (ref 0.0–0.1)
Basophils Relative: 0 % (ref 0–1)
EOS PCT: 2 % (ref 0–5)
Eosinophils Absolute: 0.1 10*3/uL (ref 0.0–0.7)
HCT: 45.1 % (ref 39.0–52.0)
HEMOGLOBIN: 14.7 g/dL (ref 13.0–17.0)
LYMPHS ABS: 1.8 10*3/uL (ref 0.7–4.0)
Lymphocytes Relative: 32 % (ref 12–46)
MCH: 29.8 pg (ref 26.0–34.0)
MCHC: 32.6 g/dL (ref 30.0–36.0)
MCV: 91.3 fL (ref 78.0–100.0)
MONOS PCT: 11 % (ref 3–12)
Monocytes Absolute: 0.6 10*3/uL (ref 0.1–1.0)
Neutro Abs: 3.1 10*3/uL (ref 1.7–7.7)
Neutrophils Relative %: 55 % (ref 43–77)
Platelets: 213 10*3/uL (ref 150–400)
RBC: 4.94 MIL/uL (ref 4.22–5.81)
RDW: 13.4 % (ref 11.5–15.5)
WBC: 5.6 10*3/uL (ref 4.0–10.5)

## 2015-03-21 LAB — SALICYLATE LEVEL

## 2015-03-21 LAB — ACETAMINOPHEN LEVEL

## 2015-03-21 LAB — ETHANOL: Alcohol, Ethyl (B): <5 mg/dL (ref <5)

## 2015-03-21 NOTE — ED Notes (Signed)
Unable to fully complete triage due to sedation

## 2015-03-21 NOTE — ED Notes (Addendum)
Spoke to pt's foster mother.  She reports that pt took the pills and drank alcohol to "get high."  She was notified that he was brought it for OD.  She was also notified that the [facility] was to [facility] but [facility], [facility]  Olivia Mackieressa SW/TTS is aware and Josephina FNP notified.  Olivia Mackieressa at bedside to speak to foster mom.

## 2015-03-21 NOTE — BH Assessment (Signed)
Assessment Note  Tracker Garrett Carrillo is an 24 y.o. male. Patient was brought into the ED by EMS initiated by a brother because of overdose.  Per documentation the patient took 6 Xanax and drank an unknown amount of alcohol.  Patient arrived to the ED combative. Patient was sedated with IM Haldol because of aggressive behaviors.  Patient gave staff permission to speak with family/girlfriend to collect collateral information.  Patient is very sedated and unable to answer questions appropriated.    CSW attempted to contact the patient's girlfriend with the number given [phone].    CSW spoke with the patient's foster mother to collect collateral information.  Patient has problems with his two baby mother's and they are both refusing to allow him to spend time with him.  Patient does not have a history of mental health or medications other than ADHD.  [facility] but never followed with outpatient recommendations.    CSW consulted with Dr. Jannifer Carrillo it is recommended to inpatient treatment for stabilization and safety.    Axis I: Mood Disorder NOS Axis II: Deferred Axis III:  Past Medical History  Diagnosis Date  . ADHD (attention deficit hyperactivity disorder)   . Suicide attempt    Axis IV: other psychosocial or environmental problems, problems related to social environment, problems with access to health care services and problems with primary support group Axis V: 41-50 serious symptoms  Past Medical History:  Past Medical History  Diagnosis Date  . ADHD (attention deficit hyperactivity disorder)   . Suicide attempt     Past Surgical History  Procedure Laterality Date  . Thumb surgery Right 2009  . Eardrum repair Right     Family History:  Family History  Problem Relation Age of Onset  . Adopted: Yes  . Hypertension Mother   . Hypertension Maternal Grandmother   . Hypertension Maternal Grandfather   . Hypertension Paternal Grandmother   . Hypertension  Paternal Grandfather     Social History:  reports that he has been smoking Cigarettes.  He has a 10 pack-year smoking history. He does not have any smokeless tobacco history on file. He reports that he drinks alcohol. He reports that he uses illicit drugs (Marijuana and Benzodiazepines).  Additional Social History:     CIWA: CIWA-Ar BP: 117/68 mmHg Pulse Rate: (!) 59 COWS:    Allergies:  Allergies  Allergen Reactions  . Penicillins Anaphylaxis  . Sulfur Anaphylaxis    Home Medications:  (Not in a hospital admission)  OB/GYN Status:  No LMP for male patient.  General Assessment Data [facility] TTS Assessment: In system Is this a Tele or Face-to-Face Assessment?: Face-to-Face Is this an Initial Assessment or a Re-assessment for this encounter?: Initial Assessment Marital status: Single Is patient pregnant?: No Pregnancy Status: No Living Arrangements: Parent Can pt return to current living arrangement?: Yes Admission Status: Involuntary Is patient capable of signing voluntary admission?: No Referral Source: Self/Family/Friend  Medical Screening Exam [facility]) Medical Exam completed: Yes  Crisis Care Plan Living Arrangements: Parent Name of Psychiatrist: none Name of Therapist: none  Education Status Is patient currently in school?: No  Risk to self with the past 6 months Suicidal Ideation: No (Pt denies at this time) Has patient been a risk to self within the past 6 months prior to admission? : Yes Suicidal Intent:  (Pt denies current) Has patient had any suicidal intent within the past 6 months prior to admission? : Yes (  Pt denies ) Is patient at risk for suicide?: Yes Suicidal Plan?:  (Pt denies currently) Has patient had any suicidal plan within the past 6 months prior to admission? : Yes Access to Means: Yes Specify Access to Suicidal Means: overdose on medications What has been your use of drugs/alcohol within  the last 12 months?: alcohol use Previous Attempts/Gestures: Yes How many times?: 1 Triggers for Past Attempts: Other personal contacts Intentional Self Injurious Behavior: None Family Suicide History: Unknown Recent stressful life event(s): Loss (Comment), Conflict (Comment) Persecutory voices/beliefs?: No Depression: Yes Depression Symptoms: Loss of interest in usual pleasures, Feeling angry/irritable (hopelessness) Substance abuse history and/or treatment for substance abuse?: Yes  Risk to Others within the past 6 months Homicidal Ideation: No-Not Currently/Within Last 6 Months Does patient have any lifetime risk of violence toward others beyond the six months prior to admission? : No Thoughts of Harm to Others: No-Not Currently Present/Within Last 6 Months Current Homicidal Intent: No-Not Currently/Within Last 6 Months Current Homicidal Plan: No-Not Currently/Within Last 6 Months Access to Homicidal Means: No History of harm to others?: No Assessment of Violence: On admission Violent Behavior Description: verbal aggression Does patient have access to weapons?: No Criminal Charges Pending?: No Does patient have a court date: No Is patient on probation?: Unknown  Psychosis Hallucinations: None noted Delusions: None noted  Mental Status Report Appearance/Hygiene: In hospital gown Eye Contact: Poor Motor Activity: Unable to assess Speech: Unable to assess Level of Consciousness: Sedated Mood: Other (Comment) (unable to assessed) Affect: Unable to Assess Anxiety Level: None Thought Processes: Unable to Assess Judgement: Unable to Assess Obsessive Compulsive Thoughts/Behaviors: Unable to Assess  Cognitive Functioning Concentration: Unable to Assess Memory: Unable to Assess IQ:  (unable to assess) Insight: Unable to Assess Impulse Control: Unable to Assess Appetite:  (unable to assess) Sleep: Unable to Assess Vegetative Symptoms: Unable to Assess  ADLScreening [facility]  Assessment Services) Patient's cognitive ability adequate to safely complete daily activities?: Yes Patient able to express need for assistance with ADLs?: Yes Independently performs ADLs?: Yes (appropriate for developmental age)  Prior Inpatient Therapy Prior Inpatient Therapy: Yes Prior Therapy Dates:  [facility]) Prior Therapy Facilty/Provider(s): 2016 Reason for Treatment: SI  Prior Outpatient Therapy Prior Outpatient Therapy: No Does patient have an ACCT team?: No Does patient have Intensive In-House Services?  : No Does patient have [facility] services? : No Does patient have P4CC services?: No  ADL Screening (condition at time of admission) Patient's cognitive ability adequate to safely complete daily activities?: Yes Patient able to express need for assistance with ADLs?: Yes Independently performs ADLs?: Yes (appropriate for developmental age)                  Additional Information 1:1 In Past 12 Months?: No CIRT Risk: No Elopement Risk: No Does patient have medical clearance?: Yes     Disposition:  Disposition Initial Assessment Completed for this Encounter: Yes Disposition of Patient: Inpatient treatment program Type of inpatient treatment program: Adult  On Site Evaluation by:   Reviewed with Physician:    Garrett Carrillo A 03/21/2015 1:10 PM

## 2015-03-21 NOTE — ED Notes (Signed)
Called TTS to make them aware that pt is eating lunch and alert, so they can come do their evaluation on him now.

## 2015-03-21 NOTE — ED Notes (Signed)
Bed: RESB Expected date:  Expected time:  Means of arrival:  Comments: OD Xanax, etoh/pt sedated

## 2015-03-21 NOTE — ED Notes (Signed)
Informed Dr. Roselyn BeringJ. Knapp that pt's BP has been consistently low while sleeping however pt remains asymptomatic and easily awaken.  Now new orders at this time.

## 2015-03-21 NOTE — ED Notes (Addendum)
Pt does not want any other visitors other than his foster mother Hamilton CapriLeslie Ross (cell) [phone], (wok) [phone].  She said she can pick pt up when discharged.  They are aware that pt is to stay in the ED until psych eval in the am.

## 2015-03-21 NOTE — ED Notes (Signed)
[facility] but cant [facility] until after 7pm

## 2015-03-21 NOTE — Progress Notes (Signed)
[facility]Kelly[facility] and transfer after 7pm.  CSW informed nursing staff of the updated disposition.     Maryelizabeth Rowanressa Avina Eberle, MSW, LCSW, LCAS-A Evening Clinical Social Worker [phone]

## 2015-03-21 NOTE — ED Notes (Signed)
Patient brought in by EMS after taking 6 tabs of Xanax and unknown amount of ETOH Patient found at home after brother called EMS  Patient became combative and required Haldol 5 mg IM and Versed 5 mg IM by EMS Patient arrives to ED sedated, but responds to painful stimuli Patient removed from back board after assessment completed by Dr. Littie DeedsGentry

## 2015-03-21 NOTE — BH Assessment (Signed)
Spoke with Dr. Littie DeedsGentry who reported that pt took a bunch of Xanax and had to e chemically restrained. Pt's family is in the process of completing IVC. Pt is unable to complete TTS assessment at this time. EDP will request consult when pt is alert.

## 2015-03-21 NOTE — ED Provider Notes (Signed)
CSN: [id number]     Arrival date & time 03/21/15  [id number] History   First MD Initiated Contact with Patient 03/21/15 [phone]     Chief Complaint  Patient presents with  . Drug Overdose     (Consider location/radiation/quality/duration/timing/severity/associated sxs/prior Treatment) HPI Comments: Level 5 caveat for altered mental status  Patient is a 24 y.o. male presenting with Overdose.  Drug Overdose This is a recurrent problem. The current episode started 3 to 5 hours ago. The problem occurs constantly. The problem has not changed since onset.Pertinent negatives include no chest pain, no abdominal pain, no headaches and no shortness of breath. Nothing aggravates the symptoms. Nothing relieves the symptoms.    Past Medical History  Diagnosis Date  . ADHD (attention deficit hyperactivity disorder)   . Suicide attempt    Past Surgical History  Procedure Laterality Date  . Thumb surgery Right 2009  . Eardrum repair Right    Family History  Problem Relation Age of Onset  . Adopted: Yes  . Hypertension Mother   . Hypertension Maternal Grandmother   . Hypertension Maternal Grandfather   . Hypertension Paternal Grandmother   . Hypertension Paternal Grandfather    History  Substance Use Topics  . Smoking status: Current Every Day Smoker -- 1.00 packs/day for 10 years    Types: Cigarettes  . Smokeless tobacco: Not on file  . Alcohol Use: Yes     Comment: occasional    Review of Systems  Unable to perform ROS: Mental status change  Respiratory: Negative for shortness of breath.   Cardiovascular: Negative for chest pain.  Gastrointestinal: Negative for abdominal pain.  Neurological: Negative for headaches.      Allergies  Penicillins and Sulfur  Home Medications   Prior to Admission medications   Medication Sig Start Date End Date Taking? Authorizing Provider  albuterol (PROVENTIL HFA;VENTOLIN HFA) 108 (90 BASE) MCG/ACT inhaler Inhale 1-2 puffs into the lungs every 6  (six) hours as needed for wheezing or shortness of breath. Patient not taking: Reported on 03/21/2015 03/04/15   Arby BarretteMarcy Pfeiffer, MD  benzonatate (TESSALON) 100 MG capsule Take 1 capsule (100 mg total) by mouth every 8 (eight) hours. Patient not taking: Reported on 03/21/2015 03/04/15   Arby BarretteMarcy Pfeiffer, MD  benztropine (COGENTIN) 0.5 MG tablet Take 1 tablet (0.5 mg total) by mouth at bedtime. Patient not taking: Reported on 03/04/2015 11/28/14   Adonis BrookSheila Agustin, NP  clindamycin (CLEOCIN) 300 MG capsule Take 1 capsule (300 mg total) by mouth 3 (three) times daily. Patient not taking: Reported on 03/04/2015 12/14/14   Linna HoffJames D Kindl, MD  diclofenac (CATAFLAM) 50 MG tablet Take 1 tablet (50 mg total) by mouth 3 (three) times daily. Patient not taking: Reported on 03/04/2015 12/14/14   Linna HoffJames D Kindl, MD  FLUoxetine (PROZAC) 20 MG capsule Take 1 capsule (20 mg total) by mouth daily. Patient not taking: Reported on 03/04/2015 11/28/14   Adonis BrookSheila Agustin, NP  ibuprofen (ADVIL,MOTRIN) 800 MG tablet Take 1 tablet (800 mg total) by mouth 3 (three) times daily. 03/04/15   Arby BarretteMarcy Pfeiffer, MD  naproxen (NAPROSYN) 500 MG tablet Take 1 tablet (500 mg total) by mouth 2 (two) times daily as needed for mild pain, moderate pain or headache. Patient not taking: Reported on 03/04/2015 11/28/14   Adonis BrookSheila Agustin, NP  nicotine polacrilex (NICORETTE) 2 MG gum Take 1 each (2 mg total) by mouth as needed for smoking cessation (May have q 2h as needed). Patient not taking: Reported on 03/04/2015 11/28/14  Adonis Brook, NP  risperiDONE (RISPERDAL M-TABS) 0.5 MG disintegrating tablet Take 1 tablet (0.5 mg total) by mouth at bedtime. Patient not taking: Reported on 03/04/2015 11/28/14   Adonis Brook, NP  traZODone (DESYREL) 50 MG tablet Take 1 tablet (50 mg total) by mouth at bedtime and may repeat dose one time if needed. Patient not taking: Reported on 03/04/2015 11/28/14   Adonis Brook, NP   BP 110/59 mmHg  Pulse 79  Temp(Src) 98.1 F (36.7 C)  (Oral)  Resp 12  SpO2 100% Physical Exam  Constitutional: He appears well-developed and well-nourished.  HENT:  Head: Normocephalic and atraumatic.  Eyes: Conjunctivae and EOM are normal.  Neck: Normal range of motion. Neck supple.  Cardiovascular: Normal rate, regular rhythm and normal heart sounds.   Pulmonary/Chest: Effort normal and breath sounds normal. No respiratory distress.  Abdominal: He exhibits no distension. There is no tenderness. There is no rebound and no guarding.  Musculoskeletal: Normal range of motion.  Neurological: He is alert. GCS eye subscore is 3. GCS verbal subscore is 2. GCS motor subscore is 5.  Skin: Skin is warm and dry.  Vitals reviewed.   ED Course  Procedures (including critical care time) Labs Review Labs Reviewed  COMPREHENSIVE METABOLIC PANEL - Abnormal; Notable for the following:    Calcium 8.8 (*)    Total Protein 6.2 (*)    All other components within normal limits  URINE RAPID DRUG SCREEN (HOSP PERFORMED) - Abnormal; Notable for the following:    Benzodiazepines POSITIVE (*)    Tetrahydrocannabinol POSITIVE (*)    All other components within normal limits  ACETAMINOPHEN LEVEL - Abnormal; Notable for the following:    Acetaminophen (Tylenol), Serum <10 (*)    All other components within normal limits  CBC WITH DIFFERENTIAL/PLATELET  ETHANOL  SALICYLATE LEVEL    Imaging Review No results found.   EKG Interpretation None      MDM   Final diagnoses:  None    24 y.o. male with pertinent PMH of prior suicide attempt presents with ingestion of xanax, ETOH, unknown if attempt harm self.  Pt was combative after family called EMS when pt locked himself in bathroom.  He required 5 of haldol and valium for chemical restraint.  On arrival pt as above.  No trauma evident or reported.    Pt care to Dr. Criss Alvine pending sobriety.  I have reviewed all laboratory and imaging studies if ordered as above      Mirian Mo,  MD 03/22/15 [phone]

## 2015-03-22 ENCOUNTER — Encounter (HOSPITAL_COMMUNITY): Payer: Self-pay

## 2015-03-22 ENCOUNTER — Inpatient Hospital Stay (HOSPITAL_COMMUNITY)
Admission: AD | Admit: 2015-03-22 | Discharge: 2015-03-25 | DRG: 885 | Disposition: A | Discharge status: Home / Self Care | Payer: Medicaid Other | Source: Intra-hospital | Attending: Psychiatry | Admitting: Psychiatry

## 2015-03-22 ENCOUNTER — Ambulatory Visit (HOSPITAL_COMMUNITY)
Admit: 2015-03-22 | Discharge: 2015-03-22 | Disposition: A | Discharge status: Home / Self Care | Payer: Medicaid Other | Attending: Psychiatry | Admitting: Psychiatry

## 2015-03-22 DIAGNOSIS — F1320 Sedative, hypnotic or anxiolytic dependence, uncomplicated: Secondary | ICD-10-CM

## 2015-03-22 DIAGNOSIS — Z8249 Family history of ischemic heart disease and other diseases of the circulatory system: Secondary | ICD-10-CM | POA: Diagnosis not present

## 2015-03-22 DIAGNOSIS — F908 Attention-deficit hyperactivity disorder, other type: Secondary | ICD-10-CM | POA: Diagnosis present

## 2015-03-22 DIAGNOSIS — F1220 Cannabis dependence, uncomplicated: Secondary | ICD-10-CM | POA: Diagnosis present

## 2015-03-22 DIAGNOSIS — F17210 Nicotine dependence, cigarettes, uncomplicated: Secondary | ICD-10-CM | POA: Diagnosis present

## 2015-03-22 DIAGNOSIS — R45851 Suicidal ideations: Secondary | ICD-10-CM | POA: Diagnosis present

## 2015-03-22 DIAGNOSIS — Z9114 Patient's other noncompliance with medication regimen: Secondary | ICD-10-CM | POA: Diagnosis present

## 2015-03-22 DIAGNOSIS — F329 Major depressive disorder, single episode, unspecified: Secondary | ICD-10-CM | POA: Diagnosis present

## 2015-03-22 DIAGNOSIS — F32A Depression, unspecified: Secondary | ICD-10-CM | POA: Diagnosis present

## 2015-03-22 DIAGNOSIS — T1490XA Injury, unspecified, initial encounter: Secondary | ICD-10-CM

## 2015-03-22 DIAGNOSIS — F332 Major depressive disorder, recurrent severe without psychotic features: Secondary | ICD-10-CM | POA: Diagnosis not present

## 2015-03-22 DIAGNOSIS — F4323 Adjustment disorder with mixed anxiety and depressed mood: Secondary | ICD-10-CM | POA: Diagnosis not present

## 2015-03-22 HISTORY — DX: Depression, unspecified: F32.A

## 2015-03-22 HISTORY — DX: Major depressive disorder, single episode, unspecified: F32.9

## 2015-03-22 MED ORDER — VITAMIN B-1 100 MG PO TABS
100.0000 mg | ORAL_TABLET | Freq: Every day | ORAL | Status: DC
  Administered 2015-03-23 – 2015-03-25 (×3): 100 mg via ORAL
  Filled 2015-03-22 (×4): qty 1

## 2015-03-22 MED ORDER — LOPERAMIDE HCL 2 MG PO CAPS: 2.0000 mg | ORAL_CAPSULE | ORAL | Status: AC | PRN

## 2015-03-22 MED ORDER — CHLORDIAZEPOXIDE HCL 25 MG PO CAPS
25.0000 mg | ORAL_CAPSULE | ORAL | Status: AC
  Administered 2015-03-24 (×2): 25 mg via ORAL
  Filled 2015-03-22 (×2): qty 1

## 2015-03-22 MED ORDER — IBUPROFEN 400 MG PO TABS
400.0000 mg | ORAL_TABLET | ORAL | Status: DC | PRN
  Administered 2015-03-23: 400 mg via ORAL
  Filled 2015-03-22: qty 1

## 2015-03-22 MED ORDER — HYDROXYZINE HCL 25 MG PO TABS
25.0000 mg | ORAL_TABLET | Freq: Four times a day (QID) | ORAL | Status: AC | PRN
  Filled 2015-03-22: qty 10

## 2015-03-22 MED ORDER — BENZTROPINE MESYLATE 1 MG PO TABS
1.0000 mg | ORAL_TABLET | Freq: Four times a day (QID) | ORAL | Status: DC | PRN
  Administered 2015-03-24: 1 mg via ORAL
  Filled 2015-03-22: qty 1

## 2015-03-22 MED ORDER — TRAZODONE HCL 50 MG PO TABS
50.0000 mg | ORAL_TABLET | Freq: Every evening | ORAL | Status: DC | PRN
  Administered 2015-03-23: 50 mg via ORAL
  Filled 2015-03-22 (×2): qty 1
  Filled 2015-03-22: qty 3

## 2015-03-22 MED ORDER — LORAZEPAM 1 MG PO TABS: 1.0000 mg | ORAL_TABLET | ORAL | Status: DC | PRN

## 2015-03-22 MED ORDER — RISPERIDONE 2 MG PO TBDP: 2.0000 mg | ORAL_TABLET | Freq: Three times a day (TID) | ORAL | Status: DC | PRN

## 2015-03-22 MED ORDER — VENLAFAXINE HCL ER 37.5 MG PO CP24
37.5000 mg | ORAL_CAPSULE | Freq: Every day | ORAL | Status: DC
  Administered 2015-03-22 – 2015-03-25 (×4): 37.5 mg via ORAL
  Filled 2015-03-22 (×6): qty 1

## 2015-03-22 MED ORDER — CHLORDIAZEPOXIDE HCL 25 MG PO CAPS
25.0000 mg | ORAL_CAPSULE | Freq: Three times a day (TID) | ORAL | Status: AC
  Administered 2015-03-23 (×3): 25 mg via ORAL
  Filled 2015-03-22 (×3): qty 1

## 2015-03-22 MED ORDER — NICOTINE POLACRILEX 2 MG MT GUM
2.0000 mg | CHEWING_GUM | OROMUCOSAL | Status: DC | PRN
  Administered 2015-03-22 – 2015-03-24 (×8): 2 mg via ORAL
  Filled 2015-03-22: qty 20
  Filled 2015-03-22 (×2): qty 1

## 2015-03-22 MED ORDER — ADULT MULTIVITAMIN W/MINERALS CH
1.0000 | ORAL_TABLET | Freq: Every day | ORAL | Status: DC
  Administered 2015-03-22 – 2015-03-25 (×4): 1 via ORAL
  Filled 2015-03-22 (×6): qty 1

## 2015-03-22 MED ORDER — ALUM & MAG HYDROXIDE-SIMETH 200-200-20 MG/5ML PO SUSP: 30.0000 mL | ORAL | Status: DC | PRN

## 2015-03-22 MED ORDER — CHLORDIAZEPOXIDE HCL 25 MG PO CAPS
25.0000 mg | ORAL_CAPSULE | Freq: Every day | ORAL | Status: AC
  Administered 2015-03-25: 25 mg via ORAL
  Filled 2015-03-22: qty 1

## 2015-03-22 MED ORDER — LORAZEPAM 2 MG/ML IJ SOLN
1.0000 mg | Freq: Once | INTRAMUSCULAR | Status: AC
  Administered 2015-03-22: 1 mg via INTRAMUSCULAR
  Filled 2015-03-22: qty 1

## 2015-03-22 MED ORDER — ACETAMINOPHEN 325 MG PO TABS
650.0000 mg | ORAL_TABLET | Freq: Four times a day (QID) | ORAL | Status: DC | PRN
  Administered 2015-03-22 – 2015-03-24 (×2): 650 mg via ORAL
  Filled 2015-03-22 (×2): qty 2

## 2015-03-22 MED ORDER — HYDROXYZINE HCL 25 MG PO TABS: 25.0000 mg | ORAL_TABLET | Freq: Four times a day (QID) | ORAL | Status: DC | PRN

## 2015-03-22 MED ORDER — CHLORDIAZEPOXIDE HCL 25 MG PO CAPS: 25.0000 mg | ORAL_CAPSULE | Freq: Four times a day (QID) | ORAL | Status: AC | PRN

## 2015-03-22 MED ORDER — BENZTROPINE MESYLATE 1 MG/ML IJ SOLN: 1.0000 mg | Freq: Four times a day (QID) | INTRAMUSCULAR | Status: DC | PRN

## 2015-03-22 MED ORDER — ZIPRASIDONE MESYLATE 20 MG IM SOLR: 20.0000 mg | INTRAMUSCULAR | Status: DC | PRN

## 2015-03-22 MED ORDER — THIAMINE HCL 100 MG/ML IJ SOLN
100.0000 mg | Freq: Once | INTRAMUSCULAR | Status: AC
  Administered 2015-03-22: 100 mg via INTRAMUSCULAR
  Filled 2015-03-22: qty 2

## 2015-03-22 MED ORDER — MAGNESIUM HYDROXIDE 400 MG/5ML PO SUSP: 30.0000 mL | Freq: Every day | ORAL | Status: DC | PRN

## 2015-03-22 MED ORDER — CHLORDIAZEPOXIDE HCL 25 MG PO CAPS
25.0000 mg | ORAL_CAPSULE | Freq: Four times a day (QID) | ORAL | Status: AC
  Administered 2015-03-22 (×2): 25 mg via ORAL
  Filled 2015-03-22 (×3): qty 1

## 2015-03-22 MED ORDER — HALOPERIDOL 5 MG PO TABS
5.0000 mg | ORAL_TABLET | Freq: Four times a day (QID) | ORAL | Status: DC | PRN
  Administered 2015-03-22 – 2015-03-24 (×2): 5 mg via ORAL
  Filled 2015-03-22 (×2): qty 1

## 2015-03-22 MED ORDER — ONDANSETRON 4 MG PO TBDP: 4.0000 mg | ORAL_TABLET | Freq: Four times a day (QID) | ORAL | Status: AC | PRN

## 2015-03-22 MED ORDER — HALOPERIDOL LACTATE 5 MG/ML IJ SOLN: 5.0000 mg | Freq: Four times a day (QID) | INTRAMUSCULAR | Status: DC | PRN

## 2015-03-22 NOTE — Tx Team (Addendum)
Initial Interdisciplinary Treatment Plan   PATIENT STRESSORS: Marital or family conflict Newborn children in hospital   PATIENT STRENGTHS: Active sense of humor Communication skills Physical Health Supportive family/friends   PROBLEM LIST: Problem List/Patient Goals Date to be addressed Date deferred Reason deferred Estimated date of resolution  "I try to work my way around it, but I snap out at people"- agression 03/22/2015 03/22/2015    "twins, they came early"; "my girlfriend is not supportive, he** no" - anxiety about family problems 03/22/2015 03/22/2015    "I popped a xanny and drank" - substance abuse 03/22/2015 03/22/2015                                         DISCHARGE CRITERIA:  Ability to meet basic life and health needs Improved stabilization in mood, thinking, and/or behavior Verbal commitment to aftercare and medication compliance Withdrawal symptoms are absent or subacute and managed without 24-hour nursing intervention  PRELIMINARY DISCHARGE PLAN: Attend 12-step recovery group Participate in family therapy Placement in alternative living arrangements  PATIENT/FAMIILY INVOLVEMENT: This treatment plan has been presented to and reviewed with the patient, Garrett Carrillo . The patient and family have been given the opportunity to ask questions and make suggestions.  Aurora Maskwyman, Cuma Polyakov E 03/22/2015, 10:34 AM

## 2015-03-22 NOTE — [facility] Suicide Risk Assessment (Signed)
[facility]   Nursing information obtained from:    Demographic factors:    Current Mental Status:    Loss Factors:    Historical Factors:    Risk Reduction Factors:    Total Time spent with patient: 30 minutes Principal Problem: MDD (major depressive disorder), recurrent severe, without psychosis Diagnosis:   Patient Active Problem List   Diagnosis Date Noted  . Cannabis use disorder, moderate, dependence [F12.20]     Priority: Medium  . MDD (major depressive disorder), recurrent severe, without psychosis [F33.2] 03/22/2015  . 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 Syncope and collapse [R55]   . ADHD, impulsive type [F90.1] 11/23/2014  . Contusion of right hand [S60.221A] 03/18/2014     Continued Clinical Symptoms:  Alcohol Use Disorder Identification Test Final Score (AUDIT): 10 The "Alcohol Use Disorders Identification Test", Guidelines for Use in Primary Care, Second Edition.  World Science writerHealth Organization [facility](WHO). Score between 0-7:  no or low risk or alcohol related problems. Score between 8-15:  moderate risk of alcohol related problems. Score between 16-19:  high risk of alcohol related problems. Score 20 or above:  warrants further diagnostic evaluation for alcohol dependence and treatment.   CLINICAL FACTORS:   Alcohol/Substance Abuse/Dependencies Previous Psychiatric Diagnoses and Treatments   Musculoskeletal: Strength & Muscle Tone: within normal limits Gait & Station: normal Patient leans: N/A  Psychiatric Specialty Exam: Physical Exam  ROS  Blood pressure 110/72, pulse 95, temperature 98.9 F (37.2 C), temperature source Oral, resp. rate 18, height 6' (1.829 m), weight 80.74 kg (178 lb).Body mass index is 24.14 kg/(m^2).          Please see H&P.                                                COGNITIVE FEATURES THAT CONTRIBUTE TO RISK:  Closed-mindedness,  Polarized thinking and Thought constriction (tunnel vision)    SUICIDE RISK:   Severe:  Frequent, intense, and enduring suicidal ideation, specific plan, no subjective intent, but some objective markers of intent (i.e., choice of lethal method), the method is accessible, some limited preparatory behavior, evidence of impaired self-control, severe dysphoria/symptomatology, multiple risk factors present, and few if any protective factors, particularly a lack of social support.  PLAN OF CARE:Please see H&P.      Medical Decision Making:  Review of Psycho-Social Stressors (1), Review or order clinical lab tests (1), Review and summation of old records (2), Established Problem, Worsening (2), Review of Last Therapy Session (1), Review of Medication Regimen & Side Effects (2) and Review of New Medication or Change in Dosage (2)  I certify that inpatient services furnished can reasonably be expected to improve the patient's condition.   Nike Southers md 03/22/2015, 12:45 PM
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
## 2015-03-22 NOTE — ED Notes (Signed)
[facility]
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
## 2015-03-22 NOTE — ED Notes (Signed)
[facility]. Patient handcuffed when transported.
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
## 2015-03-22 NOTE — Plan of Care (Signed)
Problem: Alteration in mood & ability to function due to Goal: STG-Patient will comply with prescribed medication regimen (Patient will comply with prescribed medication regimen)  Outcome: Progressing Pt took his medications as ordered when offered. Remains safe on / off unit on Q 15 minutes checks.

## 2015-03-23 LAB — TSH: TSH: 0.787 u[IU]/mL (ref 0.350–4.500)

## 2015-03-23 NOTE — Tx Team (Signed)
Interdisciplinary Treatment Plan Update (Adult)  Date:  03/23/2015   Time Reviewed:  3:07 PM   Progress in Treatment: Attending groups: Yes. Participating in groups:  Yes. Taking medication as prescribed:  Yes. Tolerating medication:  Yes. Family/Significant othe contact made:  No Patient understands diagnosis:  Yes  As evidenced by seeking help with anger, substance abuse Discussing patient identified problems/goals with staff:  Yes, see initial care plan. Medical problems stabilized or resolved:  Yes. Denies suicidal/homicidal ideation: Yes. Issues/concerns per patient self-inventory:  No. Other:  New problem(s) identified:  Discharge Plan or Barriers:  Pt asking for referral to rehab, [facility]  Reason for Continuation of Hospitalization: Anxiety Depression Medication stabilization Withdrawal symptoms  Comments:  Today, 24 hours after admission,Garrett Carrillo any withdrawal sx , tolerating Librium well. Patient is more cooperative today , unlike yesterday when he had a "melt down" in writers office . Patient today reported that he is aware of himself abusing xanax and would like to get off of it. Pt reports buying xanax - 60 dollar worth , 5 $ a pill , [facility]. Patient mixed it with alcohol to get high, lost consciousness and was unresponsive , his family felt he was not breathing and brought him to ED. Patient states this as not an intentional OD . Patient denies SI/HI/AH/VH. Patient is motivated to get help with his substance abuse . 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  Discussed with pt that CSW could help with referral to substance abuse programs if he is ready to pursue it.  Estimated length of stay: 3-4 days  New goal(s):  Review of initial/current patient goals per problem list:     Attendees: Patient:  03/23/2015 3:07 PM   Family:   03/23/2015 3:07 PM    Physician:  Dr. Elna BreslowEappen, MD 03/23/2015 3:07 PM   Nursing:   Marcell Barlowhrista Dobson, RN 03/23/2015 3:07 PM   Clinical Social Worker: Daryel Geraldodney Janziel Hockett, LCSW   03/23/2015 3:07 PM   Other:  03/23/2015 3:07 PM   Other:   03/23/2015 3:07 PM   Other:  Onnie BoerJennifer Clark, Nurse CM 03/23/2015 3:07 PM   Other:  Leisa LenzValerie Enoch, [facility] 03/23/2015 3:07 PM   Other:  Tomasita Morrowelora Sutton, P4CC  03/23/2015 3:07 PM   Other:  03/23/2015 3:07 PM   Other:  03/23/2015 3:07 PM   Other:  03/23/2015 3:07 PM   Other:  03/23/2015 3:07 PM   Other:  03/23/2015 3:07 PM   Other:   03/23/2015 3:07 PM    Scribe for Treatment Team:   Ida RogueNorth, Samani Deal B, 03/23/2015 3:07 PM

## 2015-03-23 NOTE — Progress Notes (Signed)
Writer observed pt at the beginning of the shift. Pt was leaning on the doors leading to the hall. Pt had a smirk on his face as if he was waiting for someone to open the door. Pt lay down and went to sleep relatively early. Therefore Clinical research associatewriter did not get to speak directly with the pt. Will attempt to assess in the am.

## 2015-03-23 NOTE — Progress Notes (Addendum)
Patient ID: Garrett Carrillo, male   DOB: December 27, 1990, 24 y.o.   MRN: [id number] D: Client is visible on the unit, animated and intrusive, but easily redirected. Client made a joke about being deescalated during his last admission asked for one of the nurses by name "he thought he was taking me down and I took him down" Client was visited by family tonight. A: Writer introduced self to client, encouraged client to focus on treatment, attend therapy and take medications. Staff will monitor q7215min for safety. R: client is safe on the unit, attended group.

## 2015-03-23 NOTE — Progress Notes (Signed)
D: Garrett Carrillo denies SI/HI/AVH/pain. He has tested limits this a.m with behaviors such as sticking his head out the hallway, but he has made no sincere elopement attempts. Pt is able to voice needs and interact with peers. He does require redirection.  A: Meds given as ordered. Q15 safety checks maintained. Support/encouragment offered. R: Pt remains safe and continues with treatment. Will monitor for needs/safety.

## 2015-03-23 NOTE — Tx Team (Signed)
Interdisciplinary Treatment Plan Update (Adult)  Date:  03/23/2015   Time Reviewed:  8:29 AM   Progress in Treatment: Attending groups: Yes. Participating in groups:  Yes. Taking medication as prescribed:  Yes. Tolerating medication:  Yes. Family/Significant othe contact made:  Yes Patient understands diagnosis:  Yes  As evidenced by seeking help with substance abuse Discussing patient identified problems/goals with staff:  Yes, see initial care plan. Medical problems stabilized or resolved:  Yes. Denies suicidal/homicidal ideation: Yes. Issues/concerns per patient self-inventory:  No. Other:  New problem(s) identified:  Discharge Plan or Barriers:  Is asking for referral to rehab  Reason for Continuation of Hospitalization: Depression Medication stabilization Withdrawal symptoms  Comments:  Garrett Carrillo is a 24 y.o. Male who [facility] initiated by a brother because of overdose. Per documentation in ED notes - " the patient took 6 Xanax and drank an unknown amount of alcohol." [facility] arrived to the ED combative. Patient required IM Haldol because of aggressive behavior. Per initial notes - patient has problems with his baby's mothers as well as has babies in ICU , which is stressful for him.  Spoke to mother - Garrett Carrillo at [phone] -per her patient stopped taking his medications after [facility]. Patient could have taken xanax ,percocet as well as alcohol,cannabis to get high. She wanted to know when pt will be discharged , so that she can let his job know. Discussed that the decision can only be made on a day to day basis and that the average length of stay is 5-7 days.   Patient seen and evaluated this AM. Patient was initially cooperative with evaluation. Patient reported being depressed about his current situation . Pt also reported abusing a lot of xanax - 2-3 times a week , a bottle or as much as  he can get . States he abuses it due to being anxious and his current medications not working for him.Pt however later on started acting out in writer's office " started banging his head on the wall ,was not redirectable,' and hence patient needed to escorted back in to his room after a lot of encouragement and redirection. Patient also received Haldol po prn to calm him down.   Effexor trial plus librium taper for benzo withdrawal  Estimated length of stay:  3-4 days  New goal(s):  Review of initial/current patient goals per problem list:     Attendees: Patient:  03/23/2015 8:29 AM   Family:   03/23/2015 8:29 AM   Physician:  Dr. Elna BreslowEappen, MD 03/23/2015 8:29 AM   Nursing:   Marcell Barlowhrista Dobson, RN 03/23/2015 8:29 AM   Clinical Social Worker: Daryel Geraldodney Bethene Hankinson, LCSW   03/23/2015 8:29 AM   Other:  03/23/2015 8:29 AM   Other:   03/23/2015 8:29 AM   Other:  Onnie BoerJennifer Clark, Nurse CM 03/23/2015 8:29 AM   Other:  Leisa LenzValerie Enoch, [facility] 03/23/2015 8:29 AM   Other:  Tomasita Morrowelora Sutton, P4CC  03/23/2015 8:29 AM   Other:  03/23/2015 8:29 AM   Other:  03/23/2015 8:29 AM   Other:  03/23/2015 8:29 AM   Other:  03/23/2015 8:29 AM   Other:  03/23/2015 8:29 AM   Other:   03/23/2015 8:29 AM    Scribe for Treatment Team:   Ida RogueNorth, Rain Friedt B, 03/23/2015 8:29 AM

## 2015-03-23 NOTE — [facility] Group Notes (Signed)
[facility] LCSW Group Therapy  03/23/2015 , 1:28 PM   Type of Therapy:  Group Therapy  Participation Level:  Active  Participation Quality:  Attentive  Affect:  Appropriate  Cognitive:  Alert  Insight:  Improving  Engagement in Therapy:  Engaged  Modes of Intervention:  Discussion, Exploration and Socialization  Summary of Progress/Problems: Today's group focused on the term Diagnosis.  Participants were asked to define the term, and then pronounce whether it is a negative, positive or neutral term.  Pt did not attend this group as he was attending group on [address] hall.  Daryel Geraldorth, Lacosta Hargan B 03/23/2015 , 1:28 PM
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
## 2015-03-23 NOTE — Progress Notes (Signed)
Garrett Carrillo has been very eager to move to the [address] hall. Staff have repeatedly told him a bed is not yet available, but he continues to ask questions about when he will move to the hall.
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
## 2015-03-23 NOTE — [facility] Group Notes (Signed)
[facility] Group Notes:  (Nursing/MHT/Case Management/Adjunct)  Date:  03/23/2015  Time:  10:00 AM  Type of Therapy:  Nurse Education  Participation Level:  Active  Participation Quality:  Appropriate, Attentive and Sharing  Affect:  Appropriate  Cognitive:  Alert, Appropriate and Oriented  Insight:  Appropriate and Improving  Engagement in Group:  Engaged  Modes of Intervention:  Discussion, Education and Support  Summary of Progress/Problems: Garrett Carrillo was appropriate during group. He shared that his goal today is to "meet everybody and get along with everybody."  Maurine SimmeringShugart, Keerat Denicola M 03/23/2015, 10:00 AM

## 2015-03-24 NOTE — Progress Notes (Signed)
[name] Progress Note  03/24/2015 12:58 PM Garrett Carrillo  MRN:  [id number] Subjective: Patient states "I am better. I have been going for the groups , and I am learning a lot . I have decided to go for substance abuse program , I would like to go direct from here. I do want to relapse by going home from here."     Objective:Patient seen and chart reviewed.Patient seen in his room this AM. I have reviewed his care with staff. Pt reported buying xanax - 60 dollar worth prior to this admission . Patient mixed it with alcohol to get high, lost consciousness and was unresponsive , his family felt he was not breathing and brought him to ED. Patient stated this as not an intentional OD .   Patient today is improving , he is less irritable , less anxious , appears to have better coping skills than when he came in.   Denies any withdrawal sx , tolerating Librium well. Patient discussed his motivation to go to a substance abuse residential program like [facility] and wants to be send there directly from [facility] .  Patient has been attending groups activities on [address] - substance abuse groups .  Pt today denies SI/HI/AH/VH.     Principal Problem: MDD (major depressive disorder), recurrent severe, without psychosis Diagnosis:  DSM5:  Primary Psychiatric Diagnosis: MDD ,recurrent  episode , severe with psychosis   Secondary Psychiatric Diagnosis: Benzodiazepine (valium, xanax) use disorder, severe Cannabis use disorder , moderate     Patient Active Problem List   Diagnosis Date Noted  . Cannabis use disorder, moderate, dependence [F12.20]     Priority: Medium  . MDD (major depressive disorder), recurrent severe, without psychosis [F33.2] 03/22/2015  . 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 Syncope and collapse [R55]   . ADHD, impulsive type [F90.1] 11/23/2014  .  Contusion of right hand [S60.221A] 03/18/2014               Total Time spent with patient: 30 minutes   ADL's:  Intact  Sleep: Fair  Appetite:  Fair   Psychiatric Specialty Exam: Physical Exam  Review of Systems  Psychiatric/Behavioral: Positive for substance abuse. The patient is nervous/anxious.     Blood pressure 116/67, pulse 93, temperature 97.9 F (36.6 C), temperature source Oral, resp. rate 20, height 6' (1.829 m), weight 80.74 kg (178 lb).Body mass index is 24.14 kg/(m^2).  General Appearance: Fairly Groomed  Patent attorney::  Fair  Speech:  Normal Rate  Volume:  Normal  Mood:  Anxious improving  Affect:  Labile  Thought Process:  Coherent  Orientation:  Full (Time, Place, and Person)  Thought Content:  Rumination  Suicidal Thoughts:  No  Homicidal Thoughts:  No  Memory:  Immediate;   Fair Recent;   Fair Remote;   Fair  Judgement:  Fair  Insight:  Shallow  Psychomotor Activity:  Normal  Concentration:  Fair  Recall:  [employer] of Knowledge:Fair  Language: Fair  Akathisia:  No  Handed:  Right  AIMS (if indicated):     Assets:  Desire for Improvement  Sleep:  Number of Hours: 6.25   Musculoskeletal: Strength & Muscle Tone: within normal limits Gait & Station: normal Patient leans: N/A  Current Medications: Current Facility-Administered Medications  Medication Dose Route Frequency Provider Last Rate Last Dose  . acetaminophen (TYLENOL) tablet 650 mg  650 mg Oral Q6H PRN Thermon Leyland,  NP   650 mg at 03/22/15 1317  . alum & mag hydroxide-simeth (MAALOX/MYLANTA) 200-200-20 MG/5ML suspension 30 mL  30 mL Oral Q4H PRN Thermon LeylandLaura A Davis, NP      . haloperidol (HALDOL) tablet 5 mg  5 mg Oral Q6H PRN Jomarie LongsSaramma Suraya Vidrine, MD   5 mg at 03/24/15 1003   And  . benztropine (COGENTIN) tablet 1 mg  1 mg Oral Q6H PRN Jomarie LongsSaramma Chasya Zenz, MD   1 mg at 03/24/15 1003  . haloperidol lactate (HALDOL) injection 5 mg  5 mg Intramuscular Q6H PRN Jomarie LongsSaramma Virgia Kelner, MD       And  .  benztropine mesylate (COGENTIN) injection 1 mg  1 mg Intramuscular Q6H PRN Jomarie LongsSaramma Tayjah Lobdell, MD      . chlordiazePOXIDE (LIBRIUM) capsule 25 mg  25 mg Oral Q6H PRN Nicolus Ose, MD      . chlordiazePOXIDE (LIBRIUM) capsule 25 mg  25 mg Oral BH-qamhs Etta Gassett, MD   25 mg at 03/24/15 0731   Followed by  . [START ON 03/25/2015] chlordiazePOXIDE (LIBRIUM) capsule 25 mg  25 mg Oral Daily Jaiden Dinkins, MD      . hydrOXYzine (ATARAX/VISTARIL) tablet 25 mg  25 mg Oral Q6H PRN Jomarie LongsSaramma Aviyah Swetz, MD      . ibuprofen (ADVIL,MOTRIN) tablet 400 mg  400 mg Oral Q4H PRN Jomarie LongsSaramma Akera Snowberger, MD   400 mg at 03/23/15 1549  . loperamide (IMODIUM) capsule 2-4 mg  2-4 mg Oral PRN Jomarie LongsSaramma Legaci Tarman, MD      . magnesium hydroxide (MILK OF MAGNESIA) suspension 30 mL  30 mL Oral Daily PRN Thermon LeylandLaura A Davis, NP      . multivitamin with minerals tablet 1 tablet  1 tablet Oral Daily Jomarie LongsSaramma Serenity Batley, MD   1 tablet at 03/24/15 0731  . nicotine polacrilex (NICORETTE) gum 2 mg  2 mg Oral PRN Craige CottaFernando A Cobos, MD   2 mg at 03/24/15 0731  . ondansetron (ZOFRAN-ODT) disintegrating tablet 4 mg  4 mg Oral Q6H PRN Jomarie LongsSaramma Lamara Brecht, MD      . thiamine (VITAMIN B-1) tablet 100 mg  100 mg Oral Daily Ilianna Bown, MD   100 mg at 03/24/15 0731  . traZODone (DESYREL) tablet 50 mg  50 mg Oral QHS PRN Thermon LeylandLaura A Davis, NP   50 mg at 03/23/15 2117  . venlafaxine XR (EFFEXOR-XR) 24 hr capsule 37.5 mg  37.5 mg Oral Q breakfast Jomarie LongsSaramma Shaena Parkerson, MD   37.5 mg at 03/24/15 [id number]    Lab Results:  Results for orders placed or performed during the hospital encounter of 03/22/15 (from the past 48 hour(s))  TSH     Status: None   Collection Time: 03/23/15  6:50 AM  Result Value Ref Range   TSH 0.787 0.350 - 4.500 uIU/mL    Comment: [facility]    Physical Findings: AIMS: Facial and Oral Movements Muscles of Facial Expression: None, normal Lips and Perioral Area: None, normal Jaw: None, normal Tongue: None, normal,Extremity  Movements Upper (arms, wrists, hands, fingers): None, normal Lower (legs, knees, ankles, toes): None, normal, Trunk Movements Neck, shoulders, hips: None, normal, Overall Severity Severity of abnormal movements (highest score from questions above): None, normal Incapacitation due to abnormal movements: None, normal Patient's awareness of abnormal movements (rate only patient's report): No Awareness, Dental Status Current problems with teeth and/or dentures?: No Does patient usually wear dentures?: No  CIWA:  CIWA-Ar Total: 0 COWS:  COWS Total Score: 6  Assessment: Patient is a 24 yr old AAM with hx of depression as well as xanax use disorder , presented very disorganized after an accidental OD on xanax as well as alcohol. Pt today appears to calmer , with less irritability , more coping skills . Pt is motivated to go to a substance abuse program on discharge.       Treatment Plan Summary: Daily contact with patient to assess and evaluate symptoms and progress in treatment Medication management  Plan: Will continue Effexor XR 37.5 mg po daily for anxiety, depression. Will continue Trazodone 50 mg po qhs for sleep. Continue Haldol 5 mg po q6h prn , cogentin 0.5 mg po prn for severe anxiety/agitation. Will continue CIWA/librium protocol. CSW will work on referral to substance abuse programs. Reviewed labs - wnl.    Medical Decision Making Problem Points:  Established problem, stable/improving (1), Review of last therapy session (1) and Review of psycho-social stressors (1) Data Points:  Review of medication regiment & side effects (2)  I certify that inpatient services furnished can reasonably be expected to improve the patient's condition.   Celestino Ackerman MD 03/24/2015, 12:58 PM

## 2015-03-24 NOTE — [facility] Group Notes (Signed)
[facility]  03/24/2015 , 1:58 PM    Type of Therapy:  [facility] [employer] Presentation  Participation Level:  Active  Participation Quality:  Attentive  Affect:  Blunted  Cognitive:  Oriented  Insight:  Limited  Engagement in Therapy:  Engaged  Modes of Intervention:  Discussion, Education and Socialization  Summary of Progress/Problems:  Garrett Carrillo from [employer] came to present his recovery story and play the guitar.  Stayed the entire time.  Slept the entire time.  Garrett Carrillo, Garrett Carrillo 03/24/2015 , 1:58 PM

## 2015-03-24 NOTE — Progress Notes (Signed)
Patient up and visible this morning. Silly, intrusive and attention seeking. Rating his depression, hopelessness and anxiety all at a 0/10. Then at 1000 patient approached this RN asking for a prn to help him. "I feel like I'm going to explode. I just got off the phone with my 24 year old's mom who says he was rushed to the ER. 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 She said he was calling out for me." Patient medicated per orders. Given haldol and cogentin prn. Praised patient for asking for help rather than acting out. Redirected behavior when needed. Mood and affect remain labile. Denies SI/HI/AVH and remains safe. Lawrence MarseillesFriedman, Abanoub Hanken Eakes

## 2015-03-24 NOTE — Progress Notes (Signed)
D:Patient in the hallway on approach.  Patient states his day has been ok.  Patient states his sons mother has been upsetting him.  Patient states she called him today and told him their son had to be rushed to the hospital.  Per patient report  she visited today and stated she brought patient clothing.  Patient waited for the clothing tonight and then when he called her to see where they were she hung up on him while he was speaking to his son on the phone.  Patient states she has passive SI but voluntarily contracts for safety.  Patient denies HI/AVH A: Staff to monitor Q 15 mins for safety.  Encouragement and support offered.  Scheduled medications administered per orders. R: Patient remains safe on the unit.  Patient attended group tonight.  Patient visible on the unit and interacting peers.  Patient taking administered medications.

## 2015-03-24 NOTE — Progress Notes (Signed)
Adult Psychoeducational Group Note  Date:  03/24/2015 Time:  10:19 PM  Group Topic/Focus:  Wrap-Up Group:   The focus of this group is to help patients review their daily goal of treatment and discuss progress on daily workbooks.  Participation Level:  Active  Participation Quality:  Intrusive  Affect:  Blunted  Cognitive:  Oriented  Insight: Limited  Engagement in Group:  Engaged  Modes of Intervention:  Socialization and Support  Additional Comments:  Patient attended and participated in group tonight. He reports having a good day today. He was visited by his youngest child's mother. He got upset over something that they were discussing. He took a deep breath, counted to 10 backwards and was able to calm down to completed their discussion. He had a wonderful day after that.  Lita MainsFrancis, Gail Vendetti [facility] 03/24/2015, 10:19 PM

## 2015-03-24 NOTE — Clinical Social Work Note (Signed)
Pt is not eligible for [employer] due to pending court dates: 03/31/15, 04/21/15, and 04/29/15.  The Sherwin-WilliamsHeather Smart, LCSWA 03/24/2015 3:46 PM

## 2015-03-24 NOTE — [facility] Group Notes (Signed)
[facility]   03/24/2015 1:55 PM  Participation Quality:  Engaged  Mood/Affect:  Appropriate  Depression Rating:    Anxiety Rating:    Thoughts of Suicide:  No Will you contract for safety?   NA  Current AVH:  No  Plan for Discharge/Comments:  Reiterates that he wants to go to rehab from here.  "I'll relapse if I go home."  Also revealed that he has court date on 11th, and wonders what we will do if he is still here.  After answering that question,  I let him know that may jeopardize the possibility of rehab since it is a first appearance and he does not have a lawyer that can get it continued.  Transportation Means:   Supports:  [name], [name]

## 2015-03-24 NOTE — Plan of Care (Signed)
Problem: Ineffective individual coping Goal: STG:Pt. will utilize relaxation techniques to reduce stress STG: Patient will utilize relaxation techniques to reduce stress levels  Outcome: Progressing Patient upset about son with "tumor around his heart." Was able to verbalize need for prn and need for quiet in his room.  Problem: Aggression Towards others,Towards Self, and or Destruction Goal: STG-Patient will comply with prescribed medication regimen (Patient will comply with prescribed medication regimen)  Outcome: Progressing Patient is med compliant. Able to express need for prn medications.

## 2015-03-25 ENCOUNTER — Encounter (HOSPITAL_COMMUNITY): Payer: Self-pay | Admitting: Registered Nurse

## 2015-03-25 MED ORDER — IBUPROFEN 400 MG PO TABS: 400.0000 mg | ORAL_TABLET | ORAL | Status: DC | PRN

## 2015-03-25 MED ORDER — TRAZODONE HCL 50 MG PO TABS
50.0000 mg | ORAL_TABLET | Freq: Every evening | ORAL | Status: DC | PRN
Start: 2015-03-25 — End: 2017-10-08

## 2015-03-25 MED ORDER — NICOTINE POLACRILEX 2 MG MT GUM: 2.0000 mg | CHEWING_GUM | OROMUCOSAL | Status: DC | PRN

## 2015-03-25 MED ORDER — VENLAFAXINE HCL ER 37.5 MG PO CP24: 37.5000 mg | ORAL_CAPSULE | Freq: Every day | ORAL | Status: DC

## 2015-03-25 MED ORDER — HYDROXYZINE HCL 25 MG PO TABS: 25.0000 mg | ORAL_TABLET | Freq: Four times a day (QID) | ORAL | Status: DC | PRN

## 2015-03-25 MED ORDER — ALBUTEROL SULFATE HFA 108 (90 BASE) MCG/ACT IN AERS: 1.0000 | INHALATION_SPRAY | Freq: Four times a day (QID) | RESPIRATORY_TRACT | Status: DC | PRN

## 2015-03-25 NOTE — B[facility] Suicide Risk Assessment (Signed)
[facility]   Demographic Factors:  Male  Total Time spent with patient: 30 minutes  Musculoskeletal: Strength & Muscle Tone: within normal limits Gait & Station: normal Patient leans: N/A  Psychiatric Specialty Exam: Physical Exam  ROS  Blood pressure 107/67, pulse 72, temperature 98.3 F (36.8 C), temperature source Oral, resp. rate 20, height 6' (1.829 m), weight 80.74 kg (178 lb).Body mass index is 24.14 kg/(m^2).  General Appearance: Casual  Eye Contact::  Fair  Speech:  Clear and Coherent409  Volume:  Normal  Mood:  Euthymic  Affect:  Congruent  Thought Process:  Coherent  Orientation:  Full (Time, Place, and Person)  Thought Content:  WDL  Suicidal Thoughts:  No  Homicidal Thoughts:  No  Memory:  Immediate;   Fair Recent;   Fair Remote;   Fair  Judgement:  Fair  Insight:  Fair  Psychomotor Activity:  Normal  Concentration:  Fair  Recall:  [employer]Fair  Fund of Knowledge:Fair  Language: Fair  Akathisia:  No  Handed:  Right  AIMS (if indicated):     Assets:  Communication Skills Desire for Improvement  Sleep:  Number of Hours: 6.25  Cognition: WNL  ADL's:  Intact   Have you used any form of tobacco in the last 30 days? (Cigarettes, Smokeless Tobacco, Cigars, and/or Pipes): Yes  Has this patient used any form of tobacco in the last 30 days? (Cigarettes, Smokeless Tobacco, Cigars, and/or Pipes) Yes, A prescription for nicotine gum as patient requested is given for smoking cessation.  Mental Status Per Nursing Assessment::   On Admission:     Current Mental Status by Physician: Patient presented after an unintentional OD on xanax and alcohol. Patient with xanax use disorder as well as alcohol use disorder , several relational struggles, health issues of his children and so on. Patient has foster mother who is supportive , he is [place] at [employer]. Patient is motivated to get help with his substance abuse problems.  Loss  Factors: relational struggles   Historical Factors: Impulsivity  Risk Reduction Factors:   Employed, Living with another person, especially a relative and Positive social support  Continued Clinical Symptoms:  Alcohol/Substance Abuse/Dependencies Previous Psychiatric Diagnoses and Treatments  Cognitive Features That Contribute To Risk:  None    Suicide Risk:  Minimal: No identifiable suicidal ideation.  Patients presenting with no risk factors but with morbid ruminations; may be classified as minimal risk based on the severity of the depressive symptoms  Principal Problem: MDD (major depressive disorder), recurrent severe, without psychosis Discharge Diagnoses:  Patient Active Problem List   Diagnosis Date Noted  . Cannabis use disorder, moderate, dependence [F12.20]     Priority: Medium  . MDD (major depressive disorder), recurrent severe, without psychosis [F33.2] 03/22/2015  . 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 Syncope and collapse [R55]   . ADHD, impulsive type [F90.1] 11/23/2014  . Contusion of right hand [S60.221A] 03/18/2014    Follow-up Information    Follow up with [facility].      Follow up with [facility].      Plan Of Care/Follow-up recommendations:  Activity:  no restrictions Diet:  regular Tests:  as needed Other:  follow up with after care as needed  Is patient on multiple antipsychotic therapies at discharge:  No   Has Patient had three or more failed trials of antipsychotic monotherapy by history:  No  Recommended Plan for Multiple Antipsychotic Therapies: NA  Rhilee Currin md 03/25/2015, 10:24 AM

## 2015-03-25 NOTE — BHH Suicide Risk Assessment (Signed)
[facility] INPATIENT:  Family/Significant Other Suicide Prevention Education  Suicide Prevention Education:  Contact Attempts: Hamilton CapriLeslie Ross, mother, [phone]  has been identified by the patient as the family member/significant other with whom the patient will be residing, and identified as the person(s) who will aid the patient in the event of a mental health crisis.  With written consent from the patient, two attempts were made to provide suicide prevention education, prior to and/or following the patient's discharge.  We were unsuccessful in providing suicide prevention education.  A suicide education pamphlet was given to the patient to share with family/significant other.  Date and time of first attempt:03/25/2015 9:15 AM  Left VM msg Date and time of second attempt:Spoke to mother at 5:00PM.  She confirmed that he is returning home with her, that she appreciates all we have done for him, that he makes poor choices when not living under her roof, and that she understands SPE.  Daryel Geraldorth, Kennith Morss B 03/25/2015, 10:58 AM

## 2015-03-25 NOTE — Discharge Summary (Signed)
Physician Discharge Summary Note  Patient:  Garrett Carrillo is an 24 y.o., male MRN:  [id number] DOB:  09-Mar-1991 Patient phone:  [phone] (home)  Patient address:   [address] [place] [id number],  Total Time spent with patient: Greater than 30 minutes  Date of Admission:  03/22/2015 Date of Discharge: 03/25/2015  Reason for Admission:  Per H&P admission: Garrett Carrillo is a 24 y.o. Male who [facility] initiated by a brother because of overdose. Per documentation in ED notes - " the patient took 6 Xanax and drank an unknown amount of alcohol." [facility] arrived to the ED combative. Patient required IM Haldol because of aggressive behavior. Per initial notes - patient has problems with his baby's mothers as well as has babies in ICU , which is stressful for him. Patient seen and evaluated this AM. Patient was initially cooperative with evaluation. Patient reported being depressed about his current situation . Pt also reported abusing a lot of xanax - 2-3 times a week , a bottle or as much as Garrett Carrillo can get . States Garrett Carrillo abuses it due to being anxious and his current medications not working for him.Pt however later on started acting out in writer's office " started banging his head on the wall ,was not redirectable,' and hence patient needed to escorted back in to his room after a lot of encouragement and redirection. Patient also received Haldol po prn to calm him down   Principal Problem: MDD (major depressive disorder), recurrent severe, without psychosis Discharge Diagnoses: Patient Active Problem List   Diagnosis Date Noted  . MDD (major depressive disorder), recurrent severe, without psychosis [F33.2] 03/22/2015  . 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 Cannabis use disorder, moderate, dependence [F12.20]   . Syncope and collapse [R55]   . ADHD, impulsive type [F90.1] 11/23/2014  .  Contusion of right hand [S60.221A] 03/18/2014    Musculoskeletal: Strength & Muscle Tone: within normal limits Gait & Station: normal Patient leans: N/A  Psychiatric Specialty Exam: Physical Exam  Constitutional: Garrett Carrillo is oriented to person, place, and time.  Neck: Normal range of motion.  Respiratory: Effort normal.  Musculoskeletal: Normal range of motion.  Neurological: Garrett Carrillo is alert and oriented to person, place, and time.    Review of Systems  Psychiatric/Behavioral: Negative for suicidal ideas and hallucinations. Depression: Stable. Nervous/anxious: Stable. Insomnia: Stable.   All other systems reviewed and are negative.   Blood pressure 107/67, pulse 72, temperature 98.3 F (36.8 C), temperature source Oral, resp. rate 20, height 6' (1.829 m), weight 80.74 kg (178 lb).Body mass index is 24.14 kg/(m^2).  Have you used any form of tobacco in the last 30 days? (Cigarettes, Smokeless Tobacco, Cigars, and/or Pipes): Yes  Has this patient used any form of tobacco in the last 30 days? (Cigarettes, Smokeless Tobacco, Cigars, and/or Pipes) N/A  Past Medical History:  Past Medical History  Diagnosis Date  . ADHD (attention deficit hyperactivity disorder)   . Suicide attempt   . Depression     Past Surgical History  Procedure Laterality Date  . Thumb surgery Right 2009  . Eardrum repair Right    Family History:  Family History  Problem Relation Age of Onset  . Adopted: Yes  . Hypertension Mother   . Hypertension Maternal Grandmother   . Hypertension Maternal Grandfather   . Hypertension Paternal Grandmother   . Hypertension Paternal Grandfather    Social  History:  History  Alcohol Use  . Yes    Comment: occasional     History  Drug Use  . Yes  . Special: Marijuana, Benzodiazepines    Comment: none since Dec. 2014; [place]    History   Social History  . Marital Status: Single    Spouse Name: N/A  . Number of Children: N/A  . Years of  Education: N/A   Social History Main Topics  . Smoking status: Current Every Day Smoker -- 1.00 packs/day for 10 years    Types: Cigarettes  . Smokeless tobacco: Not on file  . Alcohol Use: Yes     Comment: occasional  . Drug Use: Yes    Special: Marijuana, Benzodiazepines     Comment: none since Dec. 2014; [place] [address]  . Sexual Activity: Yes    Birth Control/ Protection: None   Other Topics Concern  . None   Social History Narrative    Past Psychiatric History: Hospitalizations:  Outpatient Care:  Substance Abuse Care:  Self-Mutilation:  Suicidal Attempts:  Violent Behaviors:   Risk to Self: Is patient at risk for suicide?: Yes Risk to Others:   Prior Inpatient Therapy:   Prior Outpatient Therapy:    Level of Care:  OP  Hospital Course:  Garrett Carrillo was admitted for MDD (major depressive disorder), recurrent severe, without psychosis and crisis management.  Garrett Carrillo was treated discharged with the medications listed below under Medication List.  Medical problems were identified and treated as needed.  Home medications were restarted as appropriate.  Improvement was monitored by observation and Garrett Carrillo daily report of symptom reduction.  Emotional and mental status was monitored by daily self-inventory reports completed by Garrett Carrillo and clinical staff.         Garrett Carrillo was evaluated by the treatment team for stability and plans for continued recovery upon discharge.  Garrett Carrillo motivation was an integral factor for scheduling further treatment.  Employment, transportation, bed availability, health status, family support, and any pending legal issues were also considered during his hospital stay.  Garrett Carrillo was offered further treatment options upon discharge including but not limited to Residential, Intensive Outpatient, and Outpatient treatment.  Garrett Carrillo will follow up with the services as listed below under Follow Up  Information.     Upon completion of this admission the patient was both mentally and medically stable for discharge denying suicidal/homicidal ideation, auditory/visual/tactile hallucinations, delusional thoughts and paranoia.      Consults:  psychiatry  Significant Diagnostic Studies:  labs: ETOH, UDS, CBC/Diff, CMET  Discharge Vitals:   Blood pressure 107/67, pulse 72, temperature 98.3 F (36.8 C), temperature source Oral, resp. rate 20, height 6' (1.829 m), weight 80.74 kg (178 lb). Body mass index is 24.14 kg/(m^2). Lab Results:   Results for orders placed or performed during the hospital encounter of 03/22/15 (from the past 72 hour(s))  TSH     Status: None   Collection Time: 03/23/15  6:50 AM  Result Value Ref Range   TSH 0.787 0.350 - 4.500 uIU/mL    Comment: Performed at [facility]    Physical Findings: AIMS: Facial and Oral Movements Muscles of Facial Expression: None, normal Lips and Perioral Area: None, normal Jaw: None, normal Tongue: None, normal,Extremity Movements Upper (arms, wrists, hands, fingers): None, normal Lower (legs, knees, ankles, toes): None, normal, Trunk Movements Neck, shoulders, hips: None, normal, Overall Severity Severity  of abnormal movements (highest score from questions above): None, normal Incapacitation due to abnormal movements: None, normal Patient's awareness of abnormal movements (rate only patient's report): No Awareness, Dental Status Current problems with teeth and/or dentures?: No Does patient usually wear dentures?: No  CIWA:  CIWA-Ar Total: 2 COWS:  COWS Total Score: 6   See Psychiatric Specialty Exam and Suicide Risk Assessment completed by Attending Physician prior to discharge.  Discharge destination:  Home  Is patient on multiple antipsychotic therapies at discharge:  No   Has Patient had three or more failed trials of antipsychotic monotherapy by history:  No    Recommended Plan for Multiple  Antipsychotic Therapies: NA     Medication List    STOP taking these medications        benzonatate 100 MG capsule  Commonly known as:  TESSALON     benztropine 0.5 MG tablet  Commonly known as:  COGENTIN     clindamycin 300 MG capsule  Commonly known as:  CLEOCIN     diclofenac 50 MG tablet  Commonly known as:  CATAFLAM     FLUoxetine 20 MG capsule  Commonly known as:  PROZAC     naproxen 500 MG tablet  Commonly known as:  NAPROSYN     risperiDONE 0.5 MG disintegrating tablet  Commonly known as:  RISPERDAL M-TABS      TAKE these medications      Indication   albuterol 108 (90 BASE) MCG/ACT inhaler  Commonly known as:  PROVENTIL HFA;VENTOLIN HFA  Inhale 1-2 puffs into the lungs every 6 (six) hours as needed for wheezing or shortness of breath.   Indication:  Asthma     hydrOXYzine 25 MG tablet  Commonly known as:  ATARAX/VISTARIL  Take 1 tablet (25 mg total) by mouth every 6 (six) hours as needed for anxiety (or CIWA score </= 10).   Indication:  Anxiety Neurosis     ibuprofen 400 MG tablet  Commonly known as:  ADVIL,MOTRIN  Take 1 tablet (400 mg total) by mouth every 4 (four) hours as needed for mild pain, moderate pain or cramping.   Indication:  pain     nicotine polacrilex 2 MG gum  Commonly known as:  NICORETTE  Take 1 each (2 mg total) by mouth as needed for smoking cessation.   Indication:  Nicotine Addiction     traZODone 50 MG tablet  Commonly known as:  DESYREL  Take 1 tablet (50 mg total) by mouth at bedtime as needed for sleep.   Indication:  Trouble Sleeping     venlafaxine XR 37.5 MG 24 hr capsule  Commonly known as:  EFFEXOR-XR  Take 1 capsule (37.5 mg total) by mouth daily with breakfast.   Indication:  Major Depressive Disorder           Follow-up Information    Follow up with [facility].   Why:  The walk-in clinic for substance abuse assessments is every Tuesday from 9 to 11 AM.  Go see them on Tuesday the 10th of May   Contact  information:   [address]  [phone]      Follow up with [facility].   Why:  Someone from the transitional care team will contact you about your follow up appointment   Contact information:   [address] [place]  [phone]      Follow-up recommendations:  Activity:  As tolerated Diet:  As tolerated  Comments:  Patient has been instructed to take medications as prescribed; and report adverse effects to outpatient provider.  Follow up with primary doctor for any medical issues and If symptoms recur report to nearest emergency or crisis hot line.    Total Discharge Time: Greater than 30 minutes  Signed: Assunta FoundRankin, Shuvon, FNP-BC 03/25/2015, 5:18 PM

## 2015-03-25 NOTE — Progress Notes (Signed)
  [facility] Discharge [facility] :  Will you be returning to the same living situation after discharge:  No. States he is returning home with parents At discharge, do you have transportation home?: Yes,  friend Do you have the ability to pay for your medications: Yes,  mental health  Release of information consent forms completed and in the chart;  Patient's signature needed at discharge.  Patient to Follow up at: Follow-up Information    Follow up with [facility].   Why:  The walk-in clinic for substance abuse assessments is every Tuesday from 9 to 11 AM.  Go see them on Tuesday the 10th of May   Contact information:   [address]  [place]  [phone]      Follow up with [facility].   Why:  Someone from the transitional care team will contact you about your follow up appointment   Contact information:   [address] [place] [place]  [place]  [phone]      Patient denies SI/HI: Yes,  yes    Safety Planning and Suicide Prevention discussed: Yes,  yes  Have you used any form of tobacco in the last 30 days? (Cigarettes, Smokeless Tobacco, Cigars, and/or Pipes): Yes  Has patient been referred to the Quitline?: Patient refused referral  States he only wants nicorette gum prescription from MD  [name], Tramya Schoenfelder B 03/25/2015, 10:54 AM

## 2015-03-25 NOTE — Progress Notes (Signed)
Patient packed and eager for discharge. Didn't want to wait for family to pick him up at 2p therefore elected to take the bus. Discharge plan explained, Rx's given along with sample meds. All belongings returned. Patient denies SI/HI/AVH and is discharged in stable condition. Lawrence MarseillesFriedman, Aalaysia Liggins Eakes

## 2015-03-25 NOTE — Progress Notes (Signed)
Patient up and active in the milieu this morning. Mood still slightly elevated and behavior can be silly however patient is cooperative and redirectable. Patient verbalizes feeling annoyed by intrusive peer however is avoiding him and taking appropriate time outs. Patient medicated per orders. Stressed importance of remaining med compliant. Offered support and reassurance. Also praised for his coping skills regarding peer. Patient requesting discharge as he feels he needs to get back to work and back to his family. Awaiting discharge orders. Lawrence MarseillesFriedman, Yassmine Tamm [place]akes

## 2015-03-25 NOTE — [facility] Group Notes (Signed)
[facility] Group Notes:  (Nursing/MHT/Case Management/Adjunct)  Date:  03/25/2015  Time:  0930  Type of Therapy:  Nurse Education  Participation Level:  Did Not Attend  Participation Quality:      Affect:    Cognitive:    Insight:    Engagement in Group:    Modes of Intervention:    Summary of Progress/Problems: Patient was meeting with case manager regarding his discharge and was unable to attend group.  Merian CapronFriedman, Laterrica Libman [facility] 03/25/2015, 0930

## 2015-03-25 NOTE — Tx Team (Signed)
Interdisciplinary Treatment Plan Update (Adult)  Date:  03/25/2015   Time Reviewed:  10:56 AM   Progress in Treatment: Attending groups: Yes. Participating in groups:  Yes. Taking medication as prescribed:  Yes. Tolerating medication:  Yes. Family/Significant othe contact made:   Patient understands diagnosis:  Yes  As evidenced by seeking help with Discussing patient identified problems/goals with staff:  Yes, see initial care plan. Medical problems stabilized or resolved:  Yes. Denies suicidal/homicidal ideation: Yes. Issues/concerns per patient self-inventory:  No. Other:  New problem(s) identified:  Discharge Plan or Barriers:  Go stay with parents, follow up [facility], [facility] and [facility]  Reason for Continuation of Hospitalization:   Comments:  Estimated length of stay:  D/C today  New goal(s):  Review of initial/current patient goals per problem list:     Attendees: Patient:  03/25/2015 10:56 AM   Family:   03/25/2015 10:56 AM   Physician:  Jomarie LongsSaramma Eappen, MD 03/25/2015 10:56 AM   Nursing:   Orlie PollenMarion Friedman, RN 03/25/2015 10:56 AM   CSW:    Daryel Geraldodney Jorden Mahl, LCSW   03/25/2015 10:56 AM   Other:  03/25/2015 10:56 AM   Other:   03/25/2015 10:56 AM   Other:  Onnie BoerJennifer Clark, Nurse CM 03/25/2015 10:56 AM   Other:  Leisa LenzValerie Enoch, [facility] 03/25/2015 10:56 AM   Other:  Tomasita Morrowelora Sutton, P4CC  03/25/2015 10:56 AM   Other:  03/25/2015 10:56 AM   Other:  03/25/2015 10:56 AM   Other:  03/25/2015 10:56 AM   Other:  03/25/2015 10:56 AM   Other:  03/25/2015 10:56 AM   Other:   03/25/2015 10:56 AM    Scribe for Treatment Team:   Ida RogueNorth, Azjah Pardo B, 03/25/2015 10:56 AM

## 2015-03-27 LAB — VITAMIN D 1,25 DIHYDROXY
VITAMIN D3 1, 25 (OH): 45 pg/mL
Vitamin D 1, 25 (OH)2 Total: 46 pg/mL

## 2015-07-25 ENCOUNTER — Emergency Department (HOSPITAL_COMMUNITY)
Admission: EM | Admit: 2015-07-25 | Discharge: 2015-07-25 | Disposition: A | Discharge status: Home / Self Care | Payer: Medicaid Other | Attending: Emergency Medicine | Admitting: Emergency Medicine

## 2015-07-25 ENCOUNTER — Emergency Department (HOSPITAL_COMMUNITY): Payer: Medicaid Other

## 2015-07-25 ENCOUNTER — Encounter (HOSPITAL_COMMUNITY): Payer: Self-pay | Admitting: *Deleted

## 2015-07-25 ENCOUNTER — Encounter (HOSPITAL_COMMUNITY): Payer: Self-pay | Admitting: Emergency Medicine

## 2015-07-25 ENCOUNTER — Emergency Department (INDEPENDENT_AMBULATORY_CARE_PROVIDER_SITE_OTHER)
Admission: EM | Admit: 2015-07-25 | Discharge: 2015-07-25 | Disposition: A | Discharge status: Nursing Home (Includes ICF) | Payer: Medicaid Other | Source: Home / Self Care | Attending: Emergency Medicine | Admitting: Emergency Medicine

## 2015-07-25 DIAGNOSIS — Z720 Tobacco use: Secondary | ICD-10-CM | POA: Diagnosis not present

## 2015-07-25 DIAGNOSIS — F329 Major depressive disorder, single episode, unspecified: Secondary | ICD-10-CM | POA: Diagnosis not present

## 2015-07-25 DIAGNOSIS — Z880 Allergy status to penicillin: Secondary | ICD-10-CM | POA: Insufficient documentation

## 2015-07-25 DIAGNOSIS — R4182 Altered mental status, unspecified: Secondary | ICD-10-CM | POA: Diagnosis not present

## 2015-07-25 DIAGNOSIS — Z79899 Other long term (current) drug therapy: Secondary | ICD-10-CM | POA: Insufficient documentation

## 2015-07-25 DIAGNOSIS — F909 Attention-deficit hyperactivity disorder, unspecified type: Secondary | ICD-10-CM | POA: Insufficient documentation

## 2015-07-25 DIAGNOSIS — Z789 Other specified health status: Secondary | ICD-10-CM

## 2015-07-25 DIAGNOSIS — F1090 Alcohol use, unspecified, uncomplicated: Secondary | ICD-10-CM

## 2015-07-25 DIAGNOSIS — Z7289 Other problems related to lifestyle: Secondary | ICD-10-CM

## 2015-07-25 DIAGNOSIS — K2970 Gastritis, unspecified, without bleeding: Secondary | ICD-10-CM

## 2015-07-25 DIAGNOSIS — R1013 Epigastric pain: Secondary | ICD-10-CM

## 2015-07-25 DIAGNOSIS — F1010 Alcohol abuse, uncomplicated: Secondary | ICD-10-CM | POA: Diagnosis not present

## 2015-07-25 DIAGNOSIS — F1210 Cannabis abuse, uncomplicated: Secondary | ICD-10-CM | POA: Insufficient documentation

## 2015-07-25 DIAGNOSIS — R079 Chest pain, unspecified: Secondary | ICD-10-CM | POA: Diagnosis present

## 2015-07-25 LAB — COMPREHENSIVE METABOLIC PANEL
ALBUMIN: 3.9 g/dL (ref 3.5–5.0)
ALK PHOS: 47 U/L (ref 38–126)
ALT: 21 U/L (ref 17–63)
AST: 31 U/L (ref 15–41)
Anion gap: 7 (ref 5–15)
BILIRUBIN TOTAL: 1.2 mg/dL (ref 0.3–1.2)
BUN: 8 mg/dL (ref 6–20)
CALCIUM: 9.3 mg/dL (ref 8.9–10.3)
CO2: 26 mmol/L (ref 22–32)
CREATININE: 1.08 mg/dL (ref 0.61–1.24)
Chloride: 104 mmol/L (ref 101–111)
GFR calc non Af Amer: >60 mL/min (ref >60)
GLUCOSE: 98 mg/dL (ref 65–99)
Potassium: 4.5 mmol/L (ref 3.5–5.1)
SODIUM: 137 mmol/L (ref 135–145)
Total Protein: 6 g/dL — ABNORMAL LOW (ref 6.5–8.1)

## 2015-07-25 LAB — GLUCOSE, CAPILLARY: Glucose-Capillary: 91 mg/dL (ref 65–99)

## 2015-07-25 LAB — ETHANOL: Alcohol, Ethyl (B): <5 mg/dL (ref <5)

## 2015-07-25 LAB — CBC
HEMATOCRIT: 46.7 % (ref 39.0–52.0)
HEMOGLOBIN: 15.6 g/dL (ref 13.0–17.0)
MCH: 30.3 pg (ref 26.0–34.0)
MCHC: 33.4 g/dL (ref 30.0–36.0)
MCV: 90.7 fL (ref 78.0–100.0)
Platelets: 209 10*3/uL (ref 150–400)
RBC: 5.15 MIL/uL (ref 4.22–5.81)
RDW: 13.6 % (ref 11.5–15.5)
WBC: 7.1 10*3/uL (ref 4.0–10.5)

## 2015-07-25 LAB — BASIC METABOLIC PANEL
ANION GAP: 7 (ref 5–15)
BUN: 9 mg/dL (ref 6–20)
CALCIUM: 9.4 mg/dL (ref 8.9–10.3)
CO2: 28 mmol/L (ref 22–32)
Chloride: 103 mmol/L (ref 101–111)
Creatinine, Ser: 1.08 mg/dL (ref 0.61–1.24)
GFR calc non Af Amer: >60 mL/min (ref >60)
Glucose, Bld: 102 mg/dL — ABNORMAL HIGH (ref 65–99)
POTASSIUM: 4.5 mmol/L (ref 3.5–5.1)
Sodium: 138 mmol/L (ref 135–145)

## 2015-07-25 LAB — SALICYLATE LEVEL: Salicylate Lvl: <4 mg/dL (ref 2.8–30.0)

## 2015-07-25 LAB — URINALYSIS, ROUTINE W REFLEX MICROSCOPIC
BILIRUBIN URINE: NEGATIVE
Glucose, UA: NEGATIVE mg/dL
Hgb urine dipstick: NEGATIVE
KETONES UR: NEGATIVE mg/dL
Leukocytes, UA: NEGATIVE
NITRITE: NEGATIVE
PROTEIN: NEGATIVE mg/dL
Specific Gravity, Urine: 1.006 (ref 1.005–1.030)
UROBILINOGEN UA: 0.2 mg/dL (ref 0.0–1.0)
pH: 6.5 (ref 5.0–8.0)

## 2015-07-25 LAB — LIPASE, BLOOD: Lipase: 13 U/L — ABNORMAL LOW (ref 22–51)

## 2015-07-25 LAB — RAPID URINE DRUG SCREEN, HOSP PERFORMED
Amphetamines: NOT DETECTED
BARBITURATES: NOT DETECTED
Benzodiazepines: NOT DETECTED
Cocaine: NOT DETECTED
Opiates: NOT DETECTED
TETRAHYDROCANNABINOL: POSITIVE — AB

## 2015-07-25 LAB — ACETAMINOPHEN LEVEL

## 2015-07-25 LAB — I-STAT CG4 LACTIC ACID, ED: LACTIC ACID, VENOUS: 0.94 mmol/L (ref 0.5–2.0)

## 2015-07-25 LAB — I-STAT TROPONIN, ED: Troponin i, poc: 0 ng/mL (ref 0.00–0.08)

## 2015-07-25 MED ORDER — ALUM & MAG HYDROXIDE-SIMETH 200-200-20 MG/5ML PO SUSP
30.0000 mL | Freq: Once | ORAL | Status: AC
  Administered 2015-07-25: 30 mL via ORAL
  Filled 2015-07-25: qty 30

## 2015-07-25 MED ORDER — SODIUM CHLORIDE 0.9 % IV BOLUS (SEPSIS)
1000.0000 mL | Freq: Once | INTRAVENOUS | Status: AC
  Administered 2015-07-25: 1000 mL via INTRAVENOUS

## 2015-07-25 MED ORDER — LIDOCAINE VISCOUS 2 % MT SOLN
15.0000 mL | Freq: Once | OROMUCOSAL | Status: AC
  Administered 2015-07-25: 15 mL via OROMUCOSAL
  Filled 2015-07-25: qty 15

## 2015-07-25 MED ORDER — ASPIRIN 81 MG PO CHEW
324.0000 mg | CHEWABLE_TABLET | Freq: Once | ORAL | Status: AC
  Administered 2015-07-25: 324 mg via ORAL
  Filled 2015-07-25: qty 4

## 2015-07-25 NOTE — ED Notes (Signed)
Dr.Rancour to triage to eval pt.

## 2015-07-25 NOTE — ED Provider Notes (Signed)
Patient presented to the ER with abdominal and chest pain. Patient had symptoms upon awakening this morning. Patient reports constant epigastric area discomfort with radiation up into the chest and into the lower abdomen. He has had nausea but no vomiting. No hematemesis, rectal bleeding or melanotic. Patient denies any history of early heart disease in his family.  Face to face Exam: HEENT - PERRLA Lungs - CTAB Heart - RRR, no M/R/G Abd - diffuse upper abdominal tenderness Neuro - alert, oriented x3  Plan: Patient appears to have primarily upper abdominal discomfort with radiation into the chest. EKG with nonspecific changes. Troponin negative. Examination of abdomen reveals significant upper abdominal tenderness. Patient admits to drinking heavily yesterday. Further workup to rule out gastritis, although viscus perforation, pancreatitis.  Gilda Crease, MD 07/25/15 [phone]

## 2015-07-25 NOTE — ED Notes (Signed)
Patient c/o chest pain onset this morning. Patient reports he went to donate plasma and his pulse was high (around the 120s) Patient also has abdominal pain and chills. He reports he has had a lack of appetite for a couple of days.

## 2015-07-25 NOTE — ED Provider Notes (Signed)
CSN: [id number]     Arrival date & time 07/25/15  1346 History   First MD Initiated Contact with Patient 07/25/15 1432     Chief Complaint  Patient presents with  . Chest Pain   (Consider location/radiation/quality/duration/timing/severity/associated sxs/prior Treatment) HPI  He is a 24 year old man here with his girlfriend for evaluation of chest pain. History is obtained from both the patient and his girlfriend as he is very slow to respond. He states he developed chest and abdominal pain this morning. It is constant. It is worse with pressing on his belly. Nothing seems to make it better. No vomiting or diarrhea. No shortness of breath. His girlfriend states he has been having cold chills and sweats. He states he was out last night and reports 5 shots of liquor. He denies any other drug use. He does state that he feels like his mental status is slowed.  He has not eaten anything yet today, but has been drinking water.  Past Medical History  Diagnosis Date  . ADHD (attention deficit hyperactivity disorder)   . Suicide attempt   . Depression    Past Surgical History  Procedure Laterality Date  . Thumb surgery Right 2009  . Eardrum repair Right    Family History  Problem Relation Age of Onset  . Adopted: Yes  . Hypertension Mother   . Hypertension Maternal Grandmother   . Hypertension Maternal Grandfather   . Hypertension Paternal Grandmother   . Hypertension Paternal Grandfather    Social History  Substance Use Topics  . Smoking status: Current Every Day Smoker -- 1.00 packs/day for 10 years    Types: Cigarettes  . Smokeless tobacco: None  . Alcohol Use: Yes     Comment: occasional    Review of Systems As in history of present illness Allergies  Penicillins and Sulfur  Home Medications   Prior to Admission medications   Medication Sig Start Date End Date Taking? Authorizing Provider  albuterol (PROVENTIL HFA;VENTOLIN HFA) 108 (90 BASE) MCG/ACT inhaler Inhale 1-2 puffs  into the lungs every 6 (six) hours as needed for wheezing or shortness of breath. 03/25/15   Adonis Brook, NP  hydrOXYzine (ATARAX/VISTARIL) 25 MG tablet Take 1 tablet (25 mg total) by mouth every 6 (six) hours as needed for anxiety (or CIWA score </= 10). 03/25/15   Adonis Brook, NP  ibuprofen (ADVIL,MOTRIN) 400 MG tablet Take 1 tablet (400 mg total) by mouth every 4 (four) hours as needed for mild pain, moderate pain or cramping. 03/25/15   Adonis Brook, NP  nicotine polacrilex (NICORETTE) 2 MG gum Take 1 each (2 mg total) by mouth as needed for smoking cessation. 03/25/15   Adonis Brook, NP  traZODone (DESYREL) 50 MG tablet Take 1 tablet (50 mg total) by mouth at bedtime as needed for sleep. 03/25/15   Adonis Brook, NP  venlafaxine XR (EFFEXOR-XR) 37.5 MG 24 hr capsule Take 1 capsule (37.5 mg total) by mouth daily with breakfast. 03/25/15   Adonis Brook, NP   Meds Ordered and Administered this Visit  Medications - No data to display  BP 149/68 mmHg  Pulse 73  Temp(Src) 98.9 F (37.2 C) (Oral)  Resp 18  SpO2 99% No data found.   Physical Exam  Constitutional: He is oriented to person, place, and time. He appears well-developed and well-nourished. No distress.  HENT:  Mouth/Throat: No oropharyngeal exudate.  Mucous membranes slightly dry  Eyes: Conjunctivae are normal.  Neck: Neck supple.  Cardiovascular: Normal rate, regular rhythm  and normal heart sounds.   No murmur heard. Pulmonary/Chest: Effort normal and breath sounds normal. No respiratory distress. He has no wheezes. He has no rales. He exhibits tenderness (Lower sternal and left lower chest wall).  Abdominal: Soft. He exhibits no distension and no mass. There is tenderness (periumbilical and epigastric). There is no rebound and no guarding.  Neurological: He is oriented to person, place, and time.  He is alert, but has slowed response to questions    ED Course  Procedures (including critical care time)  Labs  Review Labs Reviewed  GLUCOSE, CAPILLARY    Imaging Review No results found.   MDM   1. Altered mental status, unspecified altered mental status type   2. Epigastric pain   3. Chest pain, unspecified chest pain type    CBG normal. We'll transfer to emergency room for workup of altered mental status and epigastric/chest pain. Concern is for intoxication.    Charm Rings, MD 07/25/15 [phone]

## 2015-07-25 NOTE — ED Notes (Signed)
Call report to Martie Lee, charge nurse in the ED. She requested we check his blood sugar before transferring him to the ED.

## 2015-07-25 NOTE — ED Provider Notes (Signed)
CSN: [id number]     Arrival date & time 07/25/15  1521 History   None    Chief Complaint  Patient presents with  . Chest Pain   Patient is a 24 y.o. male presenting with chest pain and general illness. The history is provided by the patient and a relative. No language interpreter was used.  Chest Pain Associated symptoms: abdominal pain, fever (subjective) and nausea   Associated symptoms: no cough, no palpitations, no shortness of breath and not vomiting   Illness Location:  Pain Quality:  Chest and abdomen Severity:  Moderate Onset quality:  Gradual Timing:  Constant Progression:  Worsening Chronicity:  New Context:  PMHx of ADHD & depression w/ previous suicide attempt presenting with sudden onset chest and abdominal pain during plasma donation earlier today. Pain is constant and radiating to lower back. Not exertional. Associated with nausea and diaphoresis. No previous history of similar symptoms. Per girlfriend when patient awoke this morning he was experiencing fever, chills, decreased appetite, nausea, and abdominal discomfort. Patient did drink liquor yesterday evening but denies illicit drug use. Associated symptoms: abdominal pain, chest pain, fever (subjective) and nausea   Associated symptoms: no cough, no diarrhea, no shortness of breath and no vomiting     Past Medical History  Diagnosis Date  . ADHD (attention deficit hyperactivity disorder)   . Suicide attempt   . Depression    Past Surgical History  Procedure Laterality Date  . Thumb surgery Right 2009  . Eardrum repair Right    Family History  Problem Relation Age of Onset  . Adopted: Yes  . Hypertension Mother   . Hypertension Maternal Grandmother   . Hypertension Maternal Grandfather   . Hypertension Paternal Grandmother   . Hypertension Paternal Grandfather    Social History  Substance Use Topics  . Smoking status: Current Every Day Smoker -- 1.00 packs/day for 10 years    Types: Cigarettes  .  Smokeless tobacco: None  . Alcohol Use: Yes     Comment: occasional    Review of Systems  Constitutional: Positive for fever (subjective), chills and appetite change (decreased).  Respiratory: Negative for cough and shortness of breath.   Cardiovascular: Positive for chest pain. Negative for palpitations and leg swelling.  Gastrointestinal: Positive for nausea and abdominal pain. Negative for vomiting and diarrhea.  All other systems reviewed and are negative.   Allergies  Penicillins and Sulfa antibiotics  Home Medications   Prior to Admission medications   Medication Sig Start Date End Date Taking? Authorizing Provider  albuterol (PROVENTIL HFA;VENTOLIN HFA) 108 (90 BASE) MCG/ACT inhaler Inhale 1-2 puffs into the lungs every 6 (six) hours as needed for wheezing or shortness of breath. 03/25/15   Adonis Brook, NP  hydrOXYzine (ATARAX/VISTARIL) 25 MG tablet Take 1 tablet (25 mg total) by mouth every 6 (six) hours as needed for anxiety (or CIWA score </= 10). 03/25/15   Adonis Brook, NP  ibuprofen (ADVIL,MOTRIN) 400 MG tablet Take 1 tablet (400 mg total) by mouth every 4 (four) hours as needed for mild pain, moderate pain or cramping. 03/25/15   Adonis Brook, NP  nicotine polacrilex (NICORETTE) 2 MG gum Take 1 each (2 mg total) by mouth as needed for smoking cessation. 03/25/15   Adonis Brook, NP  traZODone (DESYREL) 50 MG tablet Take 1 tablet (50 mg total) by mouth at bedtime as needed for sleep. 03/25/15   Adonis Brook, NP  venlafaxine XR (EFFEXOR-XR) 37.5 MG 24 hr capsule Take 1 capsule (37.5  mg total) by mouth daily with breakfast. 03/25/15   Adonis Brook, NP   BP 112/54 mmHg  Pulse 68  Temp(Src) 98.2 F (36.8 C) (Oral)  Resp 20  Ht 6' 1.5" (1.867 m)  Wt 195 lb (88.451 kg)  BMI 25.38 kg/m2  SpO2 100%   Physical Exam  Constitutional: He is oriented to person, place, and time. He appears distressed (Secondary to pain).  HENT:  Head: Normocephalic and atraumatic.  Eyes:  Conjunctivae are normal. Pupils are equal, round, and reactive to light.  Neck: Normal range of motion. Neck supple.  Cardiovascular: Normal rate, regular rhythm and intact distal pulses.   Mildly hypertensive with SBP's in high 140s  Pulmonary/Chest: Effort normal and breath sounds normal. No respiratory distress. He has no wheezes. He has no rales. He exhibits no tenderness.  Abdominal: Soft. He exhibits no distension. There is tenderness. There is no rebound and no guarding.  Musculoskeletal: Normal range of motion. He exhibits tenderness.  Neurological: He is alert and oriented to person, place, and time.  Skin: Skin is warm and dry. He is not diaphoretic.     ED Course  Procedures   Labs Review Labs Reviewed  BASIC METABOLIC PANEL - Abnormal; Notable for the following:    Glucose, Bld 102 (*)    All other components within normal limits  COMPREHENSIVE METABOLIC PANEL - Abnormal; Notable for the following:    Total Protein 6.0 (*)    All other components within normal limits  LIPASE, BLOOD - Abnormal; Notable for the following:    Lipase 13 (*)    All other components within normal limits  URINE RAPID DRUG SCREEN, HOSP PERFORMED - Abnormal; Notable for the following:    Tetrahydrocannabinol POSITIVE (*)    All other components within normal limits  ACETAMINOPHEN LEVEL - Abnormal; Notable for the following:    Acetaminophen (Tylenol), Serum <10 (*)    All other components within normal limits  CBC  URINALYSIS, ROUTINE W REFLEX MICROSCOPIC (NOT AT [facility])  SALICYLATE LEVEL  ETHANOL  I-STAT CG4 LACTIC ACID, ED  Rosezena Sensor, ED   Imaging Review Dg Chest 2 View  07/25/2015   CLINICAL DATA:  Left chest pain. Epigastric pain. Lethargy. Tobacco use.  EXAM: CHEST  2 VIEW  COMPARISON:  03/04/2015  FINDINGS: The patient is rotated to the right on today's radiograph, reducing diagnostic sensitivity and specificity.  The lungs appear clear.  Cardiac and mediastinal contours normal.   No pleural effusion identified.  IMPRESSION: 1.  No significant abnormality identified.   Electronically Signed   By: Gaylyn Rong M.D.   On: 07/25/2015 16:25   I have personally reviewed and evaluated these images and lab results as part of my medical decision-making.   EKG Interpretation   Date/Time:  Sunday July 25 2015 15:54:44 EDT Ventricular Rate:  68 PR Interval:  135 QRS Duration: 99 QT Interval:  412 QTC Calculation: 438 R Axis:   -11 Text Interpretation:  Sinus arrhythmia Nonspecific ST and T wave  abnormality Confirmed by POLLINA  MD, CHRISTOPHER [phone]) on 07/25/2015  4:01:52 PM      MDM  Mr. Florendo is a 24 yo male with PMHx of ADHD & depression w/ previous suicide attempt presenting with sudden onset chest and abdominal pain during plasma donation earlier today. Pain is constant and radiating to lower back. Not exertional. Associated with nausea and diaphoresis. No previous history of similar symptoms. Per girlfriend when patient awoke this morning he was experiencing fever,  chills, decreased appetite, nausea, and abdominal discomfort. Patient did drink liquor yesterday evening but denies illicit drug use.  Exam above notable for young male lying in stretcher in moderate distress secondary to pain. Afebrile. Not tachycardic. Not tachypneic. Breathing well on room air and maintaining saturations without supplemental oxygen. Mildly hypertensive with SBP's in high 140s. Abdominal exam notable for tender to palpation of entire upper quadrants were separated epigastrium as well as to palpation of left lower quadrant. Patient has midline and paraspinal lumbar tenderness to palpation. No C or or T-spine midline tenderness to palpation.  ASA given. EKG showing isolated T-wave inversion in lead 3. Troponin undetectable. CXR showing no evidence of PTX, or free air under diaphragm, or other acute cardiopulmonary disease. UA (-) for infection or blood or dehydration. UDS (+) for  cannabanoids. EoTH, APA, and salicylate levels all negative. CBC, CMP, and lipase all unremarkable. Patient given GI cocktail (viscous lidocaine and maalox) with near complete resolution of his pain.  Pain likely related to gastritis from large amount of alcohol intake yesterday evening. Patient discharged home in stable condition. Patient will follow-up with his PCP if pain persists to discuss initiation of antacid. Strict ED return precautions discussed. Patient stands and agrees with plan and has no further questions or concerns this time.  Patient care discussed with and followed by my attending, Dr. Dutch Quint  Final diagnoses:  Gastritis  Epigastric pain  Alcohol use    Angelina Ok, MD 07/25/15 [id number]  Gilda Crease, MD 07/27/15 [id number]

## 2015-07-25 NOTE — Discharge Instructions (Signed)
Alcohol and Nutrition °Nutrition serves two purposes. It provides energy. It also maintains body structure and function. Food supplies energy. It also provides the building blocks needed to replace worn or damaged cells. Alcoholics often eat poorly. This limits their supply of essential nutrients. This affects energy supply and structure maintenance. Alcohol also affects the body's nutrients in: °· Digestion. °· Storage. °· Using and getting rid of waste products. °IMPAIRMENT OF NUTRIENT DIGESTION AND UTILIZATION  °· Once ingested, food must be broken down into small components (digested). Then it is available for energy. It helps maintain body structure and function. Digestion begins in the mouth. It continues in the stomach and intestines, with help from the pancreas. The nutrients from digested food are absorbed from the intestines into the blood. Then they are carried to the liver. The liver prepares nutrients for: °¨ Immediate use. °¨ Storage and future use. °· Alcohol inhibits the breakdown of nutrients into usable molecules. °¨ It decreases secretion of digestive enzymes from the pancreas. °· Alcohol impairs nutrient absorption by damaging the cells lining the stomach and intestines. °· It also interferes with moving some nutrients into the blood. °· In addition, nutritional deficiencies themselves may lead to further absorption problems. °¨ For example, folate deficiency changes the cells that line the small intestine. This impairs how water is absorbed. It also affects absorbed nutrients. These include glucose, sodium, and additional folate. °· Even if nutrients are digested and absorbed, alcohol can prevent them from being fully used. It changes their transport, storage, and excretion. Impaired utilization of nutrients by alcoholics is indicated by: °¨ Decreased liver stores of vitamins, such as vitamin A. °¨ Increased excretion of nutrients such as fat. °ALCOHOL AND ENERGY SUPPLY  °· Three basic  nutritional components found in food are: °¨ Carbohydrates. °¨ Proteins. °¨ Fats. °· These are used as energy. Some alcoholics take in as much as 50% of their total daily calories from alcohol. They often neglect important foods. °· Even when enough food is eaten, alcohol can impair the ways the body controls blood sugar (glucose) levels. It may either increase or decrease blood sugar. °¨ In non-diabetic alcoholics, increased blood sugar (hyperglycemia) is caused by poor insulin secretion. It is usually temporary. °¨ Decreased blood sugar (hypoglycemia) can cause serious injury even if this condition is short-lived. Low blood sugar can happen when a fasting or malnourished person drinks alcohol. When there is no food to supply energy, stored sugar is used up. The products of alcohol inhibit forming glucose from other compounds such as amino acids. As a result, alcohol causes the brain and other body tissue to lack glucose. It is needed for energy and function. °· Alcohol is an energy source. But how the body processes and uses the energy from alcohol is complex. Also, when alcohol is substituted for carbohydrates, subjects tend to lose weight. This indicates that they get less energy from alcohol than from food. °ALCOHOL - MAINTAINING CELL STRUCTURE AND FUNCTION  °Structure °Cells are made mostly of protein. So an adequate protein diet is important for maintaining cell structure. This is especially true if cells are being damaged. Research indicates that alcohol affects protein nutrition by causing impaired: °· Digestion of proteins to amino acids. °· Processing of amino acids by the small intestine and liver. °· Synthesis of proteins from amino acids. °· Protein secretion by the liver. °Function °Nutrients are essential for the body to function well. They provide the tools that the body needs to work well:  °·   Proteins. °· Vitamins. °· Minerals. °Alcohol can disrupt body function. It may cause nutrient  deficiencies. And it may interfere with the way nutrients are processed. °Vitamins °· Vitamins are essential to maintain growth and normal metabolism. They regulate many of the body`s processes. Chronic heavy drinking causes deficiencies in many vitamins. This is caused by eating less. And, in some cases, vitamins may be poorly absorbed. For example, alcohol inhibits fat absorption. It impairs how the vitamins A, E, and D are normally absorbed along with dietary fats. Not enough vitamin A may cause night blindness. Not enough vitamin D may cause softening of the bones. °· Some alcoholics lack vitamins A, C, D, E, K, and the B vitamins. These are all involved in wound healing and cell maintenance. In particular, because vitamin K is necessary for blood clotting, lacking that vitamin can cause delayed clotting. The result is excess bleeding. Lacking other vitamins involved in brain function may cause severe neurological damage. °Minerals °Deficiencies of minerals such as calcium, magnesium, iron, and zinc are common in alcoholics. The alcohol itself does not seem to affect how these minerals are absorbed. Rather, they seem to occur secondary to other alcohol-related problems, such as: °· Less calcium absorbed. °· Not enough magnesium. °· More urinary excretion. °· Vomiting. °· Diarrhea. °· Not enough iron due to gastrointestinal bleeding. °· Not enough zinc or losses related to other nutrient deficiencies. °· Mineral deficiencies can cause a variety of medical consequences. These range from calcium-related bone disease to zinc-related night blindness and skin lesions. °ALCOHOL, MALNUTRITION, AND MEDICAL COMPLICATIONS  °Liver Disease  °· Alcoholic liver damage is caused primarily by alcohol itself. But poor nutrition may increase the risk of alcohol-related liver damage. For example, nutrients normally found in the liver are known to be affected by drinking alcohol. These include carotenoids, which are the major  sources of vitamin A, and vitamin E compounds. Decreases in such nutrients may play some role in alcohol-related liver damage. °Pancreatitis °· Research suggests that malnutrition may increase the risk of developing alcoholic pancreatitis. Research suggests that a diet lacking in protein may increase alcohol's damaging effect on the pancreas. °Brain °· Nutritional deficiencies may have severe effects on brain function. These may be permanent. Specifically, thiamine deficiencies are often seen in alcoholics. They can cause severe neurological problems. These include: °¨ Impaired movement. °¨ Memory loss seen in Wernicke-Korsakoff syndrome. °Pregnancy °· Alcohol has toxic effects on fetal development. It causes alcohol-related birth defects. They include fetal alcohol syndrome. Alcohol itself is toxic to the fetus. Also, the nutritional deficiency can affect how the fetus develops. That may compound the risk of developmental damage. °· Nutritional needs during pregnancy are 10% to 30% greater than normal. Food intake can increase by as much as 140% to cover the needs of both mother and fetus. An alcoholic mother`s nutritional problems may adversely affect the nutrition of the fetus. And alcohol itself can also restrict nutrition flow to the fetus. °NUTRITIONAL STATUS OF ALCOHOLICS  °Techniques for assessing nutritional status include: °· Taking body measurements to estimate fat reserves. They include: °¨ Weight. °¨ Height. °¨ Mass. °¨ Skin fold thickness. °· Performing blood analysis to provide measurements of circulating: °¨ Proteins. °¨ Vitamins. °¨ Minerals. °· These techniques tend to be imprecise. For many nutrients, there is no clear "cut-off" point that would allow an accurate definition of deficiency. So assessing the nutritional status of alcoholics is limited by these techniques. Dietary status may provide information about the risk of developing nutritional problems.   Dietary status is assessed by: °¨ Taking  patients' dietary histories. °¨ Evaluating the amount and types of food they are eating. °· It is difficult to determine what exact amount of alcohol begins to have damaging effects on nutrition. In general, moderate drinkers have 2 drinks or less per day. They seem to be at little risk for nutritional problems. Various medical disorders begin to appear at greater levels. °· Research indicates that the majority of even the heaviest drinkers have few obvious nutritional deficiencies. Many alcoholics who are hospitalized for medical complications of their disease do have severe malnutrition. Alcoholics tend to eat poorly. Often they eat less than the amounts of food necessary to provide enough: °¨ Carbohydrates. °¨ Protein. °¨ Fat. °¨ Vitamins A and C. °¨ B vitamins. °¨ Minerals like calcium and iron. °Of major concern is alcohol's effect on digesting food and use of nutrients. It may shift a mildly malnourished person toward severe malnutrition. °Document Released: 08/31/2005 Document Revised: 01/29/2012 Document Reviewed: 02/14/2006 °[employer]® Patient Information ©2015 [employer]. This information is not intended to replace advice given to you by your health care provider. Make sure you discuss any questions you have with your health care provider. ° °

## 2015-07-25 NOTE — ED Notes (Signed)
PT states he hasn't been sleeping or eating the past 3 days d/t stress.  C/o epigastric pain and L sided chest pain.  Denies cocaine use, states only pot and etoh.

## 2015-07-25 NOTE — ED Provider Notes (Signed)
MSE was initiated and I personally evaluated the patient and placed orders (if any) at  3:46 PM on July 25, 2015.  The patient appears stable so that the remainder of the MSE may be completed by another provider.  Patient with ongoing chest pain associated with shortness of breath and nausea. Denies any cocaine use. Triage EKG reviewed with Dr. Jomarie Longs. He agrees it represents ischemia but does not feel it meets STEMI criteria.  Glynn Octave, MD 07/25/15 [phone]

## 2015-10-04 ENCOUNTER — Emergency Department (INDEPENDENT_AMBULATORY_CARE_PROVIDER_SITE_OTHER)
Admission: EM | Admit: 2015-10-04 | Discharge: 2015-10-04 | Disposition: A | Discharge status: Home / Self Care | Payer: Medicaid Other | Source: Home / Self Care | Attending: Family Medicine | Admitting: Family Medicine

## 2015-10-04 ENCOUNTER — Encounter (HOSPITAL_COMMUNITY): Payer: Self-pay | Admitting: *Deleted

## 2015-10-04 ENCOUNTER — Emergency Department (INDEPENDENT_AMBULATORY_CARE_PROVIDER_SITE_OTHER): Payer: Medicaid Other

## 2015-10-04 DIAGNOSIS — S63502A Unspecified sprain of left wrist, initial encounter: Secondary | ICD-10-CM

## 2015-10-04 NOTE — ED Notes (Signed)
Pt  Reports   He      Struck a  Wall  4  Days  Ago  And  Twisted  His  l  Wrist          He   Has   Pain         On   Pressure        And  On  Movement

## 2015-10-04 NOTE — ED Provider Notes (Signed)
CSN: [id number]     Arrival date & time 10/04/15  1525 History   First MD Initiated Contact with Patient 10/04/15 1843     Chief Complaint  Patient presents with  . Wrist Pain   (Consider location/radiation/quality/duration/timing/severity/associated sxs/prior Treatment) Patient is a 24 y.o. male presenting with wrist pain. The history is provided by the patient.  Wrist Pain This is a new problem. The current episode started more than 2 days ago (struck a wall 4d ago and twisted wrist-left ). The problem has not changed since onset.The symptoms are aggravated by bending.    Past Medical History  Diagnosis Date  . ADHD (attention deficit hyperactivity disorder)   . Suicide attempt (HCC)   . Depression    Past Surgical History  Procedure Laterality Date  . Thumb surgery Right 2009  . Eardrum repair Right    Family History  Problem Relation Age of Onset  . Adopted: Yes  . Hypertension Mother   . Hypertension Maternal Grandmother   . Hypertension Maternal Grandfather   . Hypertension Paternal Grandmother   . Hypertension Paternal Grandfather    Social History  Substance Use Topics  . Smoking status: Current Every Day Smoker -- 1.00 packs/day for 10 years    Types: Cigarettes  . Smokeless tobacco: None  . Alcohol Use: Yes     Comment: occasional    Review of Systems  Constitutional: Negative.   Genitourinary: Negative.   Musculoskeletal: Positive for myalgias and joint swelling. Negative for gait problem.  Skin: Negative.   All other systems reviewed and are negative.   Allergies  Penicillins and Sulfa antibiotics  Home Medications   Prior to Admission medications   Medication Sig Start Date End Date Taking? Authorizing Provider  albuterol (PROVENTIL HFA;VENTOLIN HFA) 108 (90 BASE) MCG/ACT inhaler Inhale 1-2 puffs into the lungs every 6 (six) hours as needed for wheezing or shortness of breath. 03/25/15   Adonis Brook, NP  hydrOXYzine (ATARAX/VISTARIL) 25 MG  tablet Take 1 tablet (25 mg total) by mouth every 6 (six) hours as needed for anxiety (or CIWA score </= 10). 03/25/15   Adonis Brook, NP  ibuprofen (ADVIL,MOTRIN) 400 MG tablet Take 1 tablet (400 mg total) by mouth every 4 (four) hours as needed for mild pain, moderate pain or cramping. 03/25/15   Adonis Brook, NP  nicotine polacrilex (NICORETTE) 2 MG gum Take 1 each (2 mg total) by mouth as needed for smoking cessation. 03/25/15   Adonis Brook, NP  traZODone (DESYREL) 50 MG tablet Take 1 tablet (50 mg total) by mouth at bedtime as needed for sleep. 03/25/15   Adonis Brook, NP  venlafaxine XR (EFFEXOR-XR) 37.5 MG 24 hr capsule Take 1 capsule (37.5 mg total) by mouth daily with breakfast. 03/25/15   Adonis Brook, NP   Meds Ordered and Administered this Visit  Medications - No data to display  BP 130/70 mmHg  Pulse 78  Temp(Src) 98.6 F (37 C) (Oral)  Resp 16  SpO2 100% No data found.   Physical Exam  Constitutional: He is oriented to person, place, and time. He appears well-developed and well-nourished. No distress.  Musculoskeletal: Normal range of motion. He exhibits tenderness.       Left wrist: He exhibits tenderness, bony tenderness and swelling. He exhibits normal range of motion and no deformity.  Neurological: He is alert and oriented to person, place, and time.  Skin: Skin is warm and dry.  Nursing note and vitals reviewed.   ED Course  Procedures (including critical care time)  Labs Review Labs Reviewed - No data to display  Imaging Review Dg Wrist Complete Left  10/04/2015  CLINICAL DATA:  Punched wall by a accident.  Wrist pain. EXAM: LEFT WRIST - COMPLETE 3+ VIEW COMPARISON:  12/14/2014 FINDINGS: There is no evidence of fracture or dislocation. There is no evidence of arthropathy or other focal bone abnormality. Soft tissues are unremarkable. IMPRESSION: Normal Electronically Signed   By: Paulina FusiMark  Shogry M.D.   On: 10/04/2015 18:56   X-rays reviewed and report per  radiologist.   Visual Acuity Review  Right Eye Distance:   Left Eye Distance:   Bilateral Distance:    Right Eye Near:   Left Eye Near:    Bilateral Near:         MDM   1. Sprain of wrist, left, initial encounter        Linna HoffJames D Kindl, MD 10/04/15 [phone]

## 2015-10-04 NOTE — Discharge Instructions (Signed)
Wear splint for comfort, ice and advil if needed.

## 2016-02-03 ENCOUNTER — Encounter (HOSPITAL_COMMUNITY): Payer: Self-pay | Admitting: Emergency Medicine

## 2016-02-03 ENCOUNTER — Emergency Department (HOSPITAL_COMMUNITY)
Admission: EM | Admit: 2016-02-03 | Discharge: 2016-02-03 | Disposition: A | Discharge status: Home / Self Care | Payer: Medicaid Other | Source: Home / Self Care | Attending: Family Medicine | Admitting: Family Medicine

## 2016-02-03 DIAGNOSIS — S91209A Unspecified open wound of unspecified toe(s) with damage to nail, initial encounter: Secondary | ICD-10-CM

## 2016-02-03 MED ORDER — LIDOCAINE HCL 2 % IJ SOLN
INTRAMUSCULAR | Status: AC
  Filled 2016-02-03: qty 20

## 2016-02-03 NOTE — Discharge Instructions (Signed)
Fingernail or Toenail Removal  Fingernail or toenail removal is a surgical procedure to take off a nail from your finger or your toe. You may need to have a fingernail or toenail removed if it has an abnormal shape (deformity) or if it is severely injured. A fingernail or toenail may also be removed due to a bacterial infection, a severe ingrown toenail, or a fungal infection that has failed treatment with antifungal medicines.  LET YOUR HEALTH CARE PROVIDER KNOW ABOUT:   Any allergies you have.   All medicines you are taking, including vitamins, herbs, eye drops, creams, and over-the-counter medicines.   Previous problems you or members of your family have had with the use of anesthetics.   Any blood disorders you have.   Previous surgeries you have had.   Any medical conditions you may have.  RISKS AND COMPLICATIONS  Generally, this is a safe procedure. However, problems may occur, including:   Pain.   Bleeding.   Infection.   Regrowth of a deformed nail.  BEFORE THE PROCEDURE   Ask your health care provider about changing or stopping your regular medicines. This is especially important if you are taking diabetes medicines or blood thinners.   Follow instructions from your health care provider about eating or drinking restrictions.   Plan to have someone take you home after the procedure.  PROCEDURE   An IV tube will be inserted into one of your veins.   You will be given one or more of the following:    A medicine that helps you relax (sedative).    A medicine that numbs the area (local anesthetic).   After your toe or finger is numb, your health care provider will insert a blunt instrument under your nail to lift it up.   In some cases, your health care provider may also make a cut (incision) in your nail.   After your nail is lifted away from your toe or finger, your health care provider will detach it from your nail bed.   A germ-killing bandage (antiseptic dressing) will be put on your toe  or finger.  The procedure may vary among health care providers and hospitals.  AFTER THE PROCEDURE   Your blood pressure, heart rate, breathing rate, and blood oxygen level will be monitored often until the medicines you were given have worn off.   It is common to have some pain after nail removal. You will be given pain medicine as needed.   You may be given a prescription for pain medicine and antibiotic medicine.   If you had a toenail removed, you will be given a surgical shoe to wear while you recover.   If you had a fingernail removed, you may be given a finger splint to wear while you recover.     This information is not intended to replace advice given to you by your health care provider. Make sure you discuss any questions you have with your health care provider.     Document Released: 08/05/2003 Document Revised: 03/23/2015 Document Reviewed: 11/04/2014  Elsevier Interactive Patient Education 2016 [employer]

## 2016-02-03 NOTE — ED Notes (Signed)
Patient reports right great toe nail has been gradually been coming off for 2 weeks.  Patient reports toe is painful since Tuesday night.

## 2016-02-04 DIAGNOSIS — S91209A Unspecified open wound of unspecified toe(s) with damage to nail, initial encounter: Secondary | ICD-10-CM | POA: Diagnosis not present

## 2016-02-04 NOTE — ED Provider Notes (Signed)
CSN: [id number]     Arrival date & time 02/03/16  1842 History   First MD Initiated Contact with Patient 02/03/16 2011     Chief Complaint  Patient presents with  . Foot Pain   (Consider location/radiation/quality/duration/timing/severity/associated sxs/prior Treatment) HPI Partially avulsed great toe nail right foot. Injury 2 days ago. Socks pulls nail and he has pain 5. Hx of athlete foot.  Past Medical History  Diagnosis Date  . ADHD (attention deficit hyperactivity disorder)   . Suicide attempt (HCC)   . Depression    Past Surgical History  Procedure Laterality Date  . Thumb surgery Right 2009  . Eardrum repair Right    Family History  Problem Relation Age of Onset  . Adopted: Yes  . Hypertension Mother   . Hypertension Maternal Grandmother   . Hypertension Maternal Grandfather   . Hypertension Paternal Grandmother   . Hypertension Paternal Grandfather    Social History  Substance Use Topics  . Smoking status: Current Every Day Smoker -- 1.00 packs/day for 10 years    Types: Cigarettes  . Smokeless tobacco: None  . Alcohol Use: Yes     Comment: occasional    Review of Systems Avulsed toe nail  Allergies  Penicillins and Sulfa antibiotics  Home Medications   Prior to Admission medications   Medication Sig Start Date End Date Taking? Authorizing Provider  albuterol (PROVENTIL HFA;VENTOLIN HFA) 108 (90 BASE) MCG/ACT inhaler Inhale 1-2 puffs into the lungs every 6 (six) hours as needed for wheezing or shortness of breath. 03/25/15   Adonis BrookSheila Agustin, NP  hydrOXYzine (ATARAX/VISTARIL) 25 MG tablet Take 1 tablet (25 mg total) by mouth every 6 (six) hours as needed for anxiety (or CIWA score </= 10). 03/25/15   Adonis BrookSheila Agustin, NP  ibuprofen (ADVIL,MOTRIN) 400 MG tablet Take 1 tablet (400 mg total) by mouth every 4 (four) hours as needed for mild pain, moderate pain or cramping. 03/25/15   Adonis BrookSheila Agustin, NP  nicotine polacrilex (NICORETTE) 2 MG gum Take 1 each (2 mg total) by  mouth as needed for smoking cessation. 03/25/15   Adonis BrookSheila Agustin, NP  traZODone (DESYREL) 50 MG tablet Take 1 tablet (50 mg total) by mouth at bedtime as needed for sleep. 03/25/15   Adonis BrookSheila Agustin, NP  venlafaxine XR (EFFEXOR-XR) 37.5 MG 24 hr capsule Take 1 capsule (37.5 mg total) by mouth daily with breakfast. 03/25/15   Adonis BrookSheila Agustin, NP   Meds Ordered and Administered this Visit  Medications - No data to display  BP 124/84 mmHg  Pulse 72  Temp(Src) 98.7 F (37.1 C) (Oral)  Resp 16 No data found.   Physical Exam  Constitutional: He appears well-developed and well-nourished.  Pulmonary/Chest: Effort normal.  Musculoskeletal: He exhibits tenderness.       Feet:  Nursing note and vitals reviewed.   ED Course  .Nail Removal Date/Time: 02/04/2016 10:06 AM Performed by: Tharon AquasPATRICK, Dorothy Landgrebe C Authorized by: Bradd CanaryKINDL, JAMES D Consent: Verbal consent obtained. Consent given by: patient Patient understanding: patient states understanding of the procedure being performed Patient identity confirmed: verbally with patient and arm band Time out: Immediately prior to procedure a "time out" was called to verify the correct patient, procedure, equipment, support staff and site/side marked as required. Location: right foot Location details: right big toe Anesthesia: digital block Local anesthetic: lidocaine 1% with epinephrine Anesthetic total: 3 ml Patient sedated: no Preparation: skin prepped with ChloraPrep Amount removed: complete Wedge excision of skin of nail fold: no Nail bed sutured: no  Nail matrix removed: none Removed nail replaced and anchored: no Dressing: 4x4 and antibiotic ointment Patient tolerance: Patient tolerated the procedure well with no immediate complications   (including critical care time)  Labs Review Labs Reviewed - No data to display  Imaging Review No results found.   Visual Acuity Review  Right Eye Distance:   Left Eye Distance:   Bilateral Distance:      Right Eye Near:   Left Eye Near:    Bilateral Near:        Nail removed without complications.  Work note provided.  MDM   1. Nail avulsion, toe, initial encounter     Patient is reassured that there are no issues that require transfer to higher level of care at this time or additional tests. Patient is advised to continue home symptomatic treatment. Patient is advised that if there are new or worsening symptoms to attend the emergency department, contact primary care provider, or return to UC. Instructions of care provided discharged home in stable condition. Return to work/school note provided.   THIS NOTE WAS GENERATED USING A VOICE RECOGNITION SOFTWARE PROGRAM. ALL REASONABLE EFFORTS  WERE MADE TO PROOFREAD THIS DOCUMENT FOR ACCURACY.  I have verbally reviewed the discharge instructions with the patient. A printed AVS was given to the patient.  All questions were answered prior to discharge.      Tharon Aquas, PA 02/04/16 1007

## 2016-02-10 ENCOUNTER — Emergency Department (HOSPITAL_COMMUNITY)
Admission: EM | Admit: 2016-02-10 | Discharge: 2016-02-10 | Disposition: A | Discharge status: Home / Self Care | Payer: Medicaid Other | Source: Home / Self Care

## 2016-03-19 ENCOUNTER — Encounter (HOSPITAL_COMMUNITY): Payer: Self-pay | Admitting: Emergency Medicine

## 2016-03-19 ENCOUNTER — Emergency Department (HOSPITAL_COMMUNITY)
Admission: EM | Admit: 2016-03-19 | Discharge: 2016-03-19 | Disposition: A | Discharge status: Home / Self Care | Payer: Medicaid Other | Attending: Emergency Medicine | Admitting: Emergency Medicine

## 2016-03-19 DIAGNOSIS — R42 Dizziness and giddiness: Secondary | ICD-10-CM | POA: Insufficient documentation

## 2016-03-19 DIAGNOSIS — J069 Acute upper respiratory infection, unspecified: Secondary | ICD-10-CM | POA: Diagnosis not present

## 2016-03-19 DIAGNOSIS — F17210 Nicotine dependence, cigarettes, uncomplicated: Secondary | ICD-10-CM | POA: Diagnosis not present

## 2016-03-19 DIAGNOSIS — R05 Cough: Secondary | ICD-10-CM | POA: Diagnosis present

## 2016-03-19 NOTE — ED Notes (Signed)
No answer x2 

## 2016-03-19 NOTE — ED Notes (Signed)
Brought in from home via EMS.  On their arrival patient found laying in floor.  Reports having a cold with cough for a week.  Also reports being dizzy and hurting all over everytime he coughs or sneezes.
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
## 2017-03-16 ENCOUNTER — Ambulatory Visit (HOSPITAL_COMMUNITY)
Admission: EM | Admit: 2017-03-16 | Discharge: 2017-03-16 | Disposition: A | Discharge status: Home / Self Care | Payer: Medicaid Other | Attending: Family Medicine | Admitting: Family Medicine

## 2017-03-16 ENCOUNTER — Encounter (HOSPITAL_COMMUNITY): Payer: Self-pay | Admitting: Family Medicine

## 2017-03-16 DIAGNOSIS — R05 Cough: Secondary | ICD-10-CM | POA: Diagnosis not present

## 2017-03-16 DIAGNOSIS — J209 Acute bronchitis, unspecified: Secondary | ICD-10-CM

## 2017-03-16 DIAGNOSIS — R059 Cough, unspecified: Secondary | ICD-10-CM

## 2017-03-16 MED ORDER — METHYLPREDNISOLONE 4 MG PO TBPK: ORAL_TABLET | ORAL | 0 refills | Status: DC

## 2017-03-16 MED ORDER — AZITHROMYCIN 250 MG PO TABS: 250.0000 mg | ORAL_TABLET | Freq: Every day | ORAL | 0 refills | Status: DC

## 2017-03-16 MED ORDER — BENZONATATE 100 MG PO CAPS: 200.0000 mg | ORAL_CAPSULE | Freq: Three times a day (TID) | ORAL | 0 refills | Status: DC | PRN

## 2017-03-16 NOTE — ED Triage Notes (Signed)
Pt here for cough that started Wednesday and coughing up phlegm and blood.

## 2017-03-16 NOTE — ED Provider Notes (Signed)
CSN: [id number]     Arrival date & time 03/16/17  1931 History   First MD Initiated Contact with Patient 03/16/17 1958     Chief Complaint  Patient presents with  . Cough   (Consider location/radiation/quality/duration/timing/severity/associated sxs/prior Treatment) Patient here for cough and congestion for last 3 days.    The history is provided by the patient.  Cough  Cough characteristics:  Productive Sputum characteristics:  White, yellow and bloody Severity:  Mild Onset quality:  Sudden Duration:  3 days Timing:  Constant Progression:  Worsening Chronicity:  New Smoker: yes   Context: smoke exposure, upper respiratory infection and weather changes   Relieved by:  None tried Worsened by:  Nothing Ineffective treatments:  None tried   Past Medical History:  Diagnosis Date  . ADHD (attention deficit hyperactivity disorder)   . Depression   . Suicide attempt [facility])    Past Surgical History:  Procedure Laterality Date  . eardrum repair Right   . thumb surgery Right 2009   Family History  Problem Relation Age of Onset  . Adopted: Yes  . Hypertension Mother   . Hypertension Maternal Grandmother   . Hypertension Maternal Grandfather   . Hypertension Paternal Grandmother   . Hypertension Paternal Grandfather    Social History  Substance Use Topics  . Smoking status: Current Every Day Smoker    Packs/day: 1.00    Years: 10.00    Types: Cigarettes  . Smokeless tobacco: Never Used  . Alcohol use Yes     Comment: occasional    Review of Systems  Constitutional: Negative.   HENT: Negative.   Eyes: Negative.   Respiratory: Positive for cough.   Cardiovascular: Negative.   Gastrointestinal: Negative.   Endocrine: Negative.   Genitourinary: Negative.   Musculoskeletal: Negative.   Allergic/Immunologic: Negative.   Neurological: Negative.   Hematological: Negative.   Psychiatric/Behavioral: Negative.     Allergies  Penicillins and Sulfa  antibiotics  Home Medications   Prior to Admission medications   Medication Sig Start Date End Date Taking? Authorizing Provider  albuterol (PROVENTIL HFA;VENTOLIN HFA) 108 (90 BASE) MCG/ACT inhaler Inhale 1-2 puffs into the lungs every 6 (six) hours as needed for wheezing or shortness of breath. 03/25/15   Adonis Brook, NP  azithromycin (ZITHROMAX) 250 MG tablet Take 1 tablet (250 mg total) by mouth daily. Take first 2 tablets together, then 1 every day until finished. 03/16/17   Deatra Canter, FNP  benzonatate (TESSALON) 100 MG capsule Take 2 capsules (200 mg total) by mouth 3 (three) times daily as needed for cough. 03/16/17   Deatra Canter, FNP  hydrOXYzine (ATARAX/VISTARIL) 25 MG tablet Take 1 tablet (25 mg total) by mouth every 6 (six) hours as needed for anxiety (or CIWA score </= 10). 03/25/15   Adonis Brook, NP  ibuprofen (ADVIL,MOTRIN) 400 MG tablet Take 1 tablet (400 mg total) by mouth every 4 (four) hours as needed for mild pain, moderate pain or cramping. 03/25/15   Adonis Brook, NP  methylPREDNISolone (MEDROL DOSEPAK) 4 MG TBPK tablet Take 6-5-4-3-2-1 po qd 03/16/17   Deatra Canter, FNP  nicotine polacrilex (NICORETTE) 2 MG gum Take 1 each (2 mg total) by mouth as needed for smoking cessation. 03/25/15   Adonis Brook, NP  traZODone (DESYREL) 50 MG tablet Take 1 tablet (50 mg total) by mouth at bedtime as needed for sleep. 03/25/15   Adonis Brook, NP  venlafaxine XR (EFFEXOR-XR) 37.5 MG 24 hr capsule Take 1 capsule (37.5  mg total) by mouth daily with breakfast. 03/25/15   Adonis Brook, NP   Meds Ordered and Administered this Visit  Medications - No data to display  BP 126/64   Pulse 75   Temp 98.3 F (36.8 C)   Resp 18   SpO2 96%  No data found.   Physical Exam  Constitutional: He appears well-developed and well-nourished.  HENT:  Head: Normocephalic and atraumatic.  Right Ear: External ear normal.  Left Ear: External ear normal.  Mouth/Throat: Oropharynx is clear  and moist.  Eyes: Conjunctivae and EOM are normal. Pupils are equal, round, and reactive to light.  Neck: Normal range of motion. Neck supple.  Cardiovascular: Normal rate, regular rhythm and normal heart sounds.   Pulmonary/Chest: Effort normal.  Breath sounds diminished throughout.  Abdominal: Soft. Bowel sounds are normal.  Nursing note and vitals reviewed.   Urgent Care Course     Procedures (including critical care time)  Labs Review Labs Reviewed - No data to display  Imaging Review No results found.   Visual Acuity Review  Right Eye Distance:   Left Eye Distance:   Bilateral Distance:    Right Eye Near:   Left Eye Near:    Bilateral Near:         MDM   1. Cough   2. Acute bronchitis, unspecified organism    Zpak Medrol dose pack as directed  #21 Tessalon Perles  one po tid prn #30  Push po fluids, rest, tylenol and motrin otc prn as directed for fever, arthralgias, and myalgias.  Follow up prn if sx's continue or persist.    Deatra Canter, FNP 03/16/17 2016

## 2017-04-11 ENCOUNTER — Ambulatory Visit (HOSPITAL_COMMUNITY)
Admission: EM | Admit: 2017-04-11 | Discharge: 2017-04-11 | Discharge status: Against Medical Advice | Payer: Medicaid Other

## 2017-07-05 ENCOUNTER — Ambulatory Visit (HOSPITAL_COMMUNITY)
Admission: EM | Admit: 2017-07-05 | Discharge: 2017-07-05 | Disposition: A | Discharge status: Home / Self Care | Payer: Medicaid Other | Attending: Family Medicine | Admitting: Family Medicine

## 2017-07-05 ENCOUNTER — Encounter (HOSPITAL_COMMUNITY): Payer: Self-pay | Admitting: Family Medicine

## 2017-07-05 DIAGNOSIS — Z23 Encounter for immunization: Secondary | ICD-10-CM

## 2017-07-05 DIAGNOSIS — S41151A Open bite of right upper arm, initial encounter: Secondary | ICD-10-CM | POA: Diagnosis not present

## 2017-07-05 MED ORDER — TETANUS-DIPHTH-ACELL PERTUSSIS 5-2.5-18.5 LF-MCG/0.5 IM SUSP
INTRAMUSCULAR | Status: AC
Start: 2017-07-05 — End: 2017-07-05
  Filled 2017-07-05: qty 0.5

## 2017-07-05 MED ORDER — CLINDAMYCIN HCL 300 MG PO CAPS: 300.0000 mg | ORAL_CAPSULE | Freq: Three times a day (TID) | ORAL | 0 refills | Status: DC

## 2017-07-05 MED ORDER — TETANUS-DIPHTH-ACELL PERTUSSIS 5-2.5-18.5 LF-MCG/0.5 IM SUSP
0.5000 mL | Freq: Once | INTRAMUSCULAR | Status: AC
  Administered 2017-07-05: 0.5 mL via INTRAMUSCULAR
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
## 2017-07-30 ENCOUNTER — Emergency Department (HOSPITAL_COMMUNITY)
Admission: EM | Admit: 2017-07-30 | Discharge: 2017-07-30 | Disposition: A | Discharge status: Home / Self Care | Payer: Medicaid Other | Attending: Emergency Medicine | Admitting: Emergency Medicine

## 2017-07-30 ENCOUNTER — Emergency Department (HOSPITAL_COMMUNITY): Payer: Medicaid Other

## 2017-07-30 ENCOUNTER — Encounter (HOSPITAL_COMMUNITY): Payer: Self-pay | Admitting: *Deleted

## 2017-07-30 DIAGNOSIS — F17210 Nicotine dependence, cigarettes, uncomplicated: Secondary | ICD-10-CM | POA: Insufficient documentation

## 2017-07-30 DIAGNOSIS — R05 Cough: Secondary | ICD-10-CM | POA: Diagnosis not present

## 2017-07-30 DIAGNOSIS — F908 Attention-deficit hyperactivity disorder, other type: Secondary | ICD-10-CM | POA: Diagnosis not present

## 2017-07-30 DIAGNOSIS — R059 Cough, unspecified: Secondary | ICD-10-CM

## 2017-07-30 MED ORDER — ALBUTEROL SULFATE HFA 108 (90 BASE) MCG/ACT IN AERS
2.0000 | INHALATION_SPRAY | RESPIRATORY_TRACT | Status: DC | PRN
  Administered 2017-07-30: 2 via RESPIRATORY_TRACT
  Filled 2017-07-30: qty 6.7

## 2017-07-30 MED ORDER — BENZONATATE 100 MG PO CAPS: 200.0000 mg | ORAL_CAPSULE | Freq: Two times a day (BID) | ORAL | 0 refills | Status: DC | PRN

## 2017-07-30 MED ORDER — AZITHROMYCIN 250 MG PO TABS: 250.0000 mg | ORAL_TABLET | Freq: Every day | ORAL | 0 refills | Status: DC
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
## 2017-07-30 NOTE — ED Triage Notes (Signed)
The pt woke up couoghing an hour ago  The person with him reports that he has anxiety  He is a smoker  He coughs then vomits

## 2017-10-07 DIAGNOSIS — H5711 Ocular pain, right eye: Secondary | ICD-10-CM | POA: Insufficient documentation

## 2017-10-07 DIAGNOSIS — R600 Localized edema: Secondary | ICD-10-CM | POA: Diagnosis not present

## 2017-10-07 DIAGNOSIS — Z5321 Procedure and treatment not carried out due to patient leaving prior to being seen by health care provider: Secondary | ICD-10-CM | POA: Diagnosis not present

## 2017-10-08 ENCOUNTER — Emergency Department (HOSPITAL_COMMUNITY)
Admission: EM | Admit: 2017-10-08 | Discharge: 2017-10-08 | Disposition: A | Discharge status: Home / Self Care | Payer: Medicaid Other | Attending: Emergency Medicine | Admitting: Emergency Medicine

## 2017-10-08 ENCOUNTER — Other Ambulatory Visit: Payer: Self-pay

## 2017-10-08 ENCOUNTER — Encounter (HOSPITAL_COMMUNITY): Payer: Self-pay | Admitting: Emergency Medicine

## 2017-10-08 ENCOUNTER — Ambulatory Visit (HOSPITAL_COMMUNITY)
Admission: EM | Admit: 2017-10-08 | Discharge: 2017-10-08 | Disposition: A | Discharge status: Home / Self Care | Payer: Medicaid Other | Attending: Family Medicine | Admitting: Family Medicine

## 2017-10-08 DIAGNOSIS — H5711 Ocular pain, right eye: Secondary | ICD-10-CM

## 2017-10-08 MED ORDER — POLYETHYL GLYCOL-PROPYL GLYCOL 0.4-0.3 % OP SOLN: 1.0000 [drp] | Freq: Four times a day (QID) | OPHTHALMIC | 0 refills | Status: DC | PRN

## 2017-10-08 MED ORDER — OFLOXACIN 0.3 % OP SOLN: OPHTHALMIC | 0 refills | Status: DC

## 2017-10-08 MED ORDER — POLYETHYL GLYCOL-PROPYL GLYCOL 0.4-0.3 % OP GEL: 1.0000 "application " | Freq: Every evening | OPHTHALMIC | 0 refills | Status: DC | PRN

## 2017-10-08 NOTE — Discharge Instructions (Signed)
Right eye redness could be due to irritation/dry eyes/infection. Use ofloxacin eyedrops as directed on right eye. Artificial tear drop during the day. Artificial tear gel at night. Wait 10-15 minutes between drops, always use artificial tear gel last, as it prevents drops from penetrating through. Lid scrubs and warm compresses as directed. Monitor for any worsening of symptoms, changes in vision, sensitivity to light, eye swelling, follow up with ophthalmology for further evaluation.

## 2017-10-08 NOTE — ED Triage Notes (Addendum)
Pt reports he woke up w/ sharp shooting pains in his right eye.  He reports his vision was blurry but not now.  Hurts to "move my eye left or right."  No reason for injury known.

## 2017-10-08 NOTE — ED Notes (Signed)
No answer when called for vitals. 

## 2017-10-08 NOTE — ED Notes (Signed)
Pt was called for vitals re-check, no answer. 

## 2017-10-08 NOTE — ED Notes (Signed)
Still no answer when called.  Removed from system at this time.

## 2017-10-08 NOTE — ED Triage Notes (Signed)
PT reports right eye pain that started yesterday with a headache. PT went to ER yesterday, but the wait was too long. PT reports some blurred vision yesterday. PT's right eye is red and inflamed. PT reports right side facial swelling as well. PT denies headache at this time, it resolved after taking tylenol.
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
## 2018-05-06 ENCOUNTER — Encounter (HOSPITAL_COMMUNITY): Payer: Self-pay | Admitting: Family Medicine

## 2018-05-06 ENCOUNTER — Ambulatory Visit (HOSPITAL_COMMUNITY)
Admission: EM | Admit: 2018-05-06 | Discharge: 2018-05-06 | Disposition: A | Discharge status: Home / Self Care | Payer: Medicaid Other | Attending: Internal Medicine | Admitting: Internal Medicine

## 2018-05-06 ENCOUNTER — Ambulatory Visit (INDEPENDENT_AMBULATORY_CARE_PROVIDER_SITE_OTHER): Payer: Medicaid Other

## 2018-05-06 DIAGNOSIS — M25532 Pain in left wrist: Secondary | ICD-10-CM | POA: Diagnosis not present

## 2018-05-06 MED ORDER — MELOXICAM 7.5 MG PO TABS: 7.5000 mg | ORAL_TABLET | Freq: Every day | ORAL | 0 refills | Status: DC
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
## 2018-05-06 NOTE — Discharge Instructions (Signed)
X-ray negative for fracture or dislocation.  Start Mobic as directed.  Ice compress, elevation, wrist splint during activity.  This may take a few weeks to completely resolve, but should be feeling better each week.  Follow-up with PCP if symptoms not improving, worsens, does not resolve.

## 2019-02-20 ENCOUNTER — Other Ambulatory Visit: Payer: Self-pay

## 2019-02-20 ENCOUNTER — Encounter (HOSPITAL_COMMUNITY): Payer: Self-pay

## 2019-02-20 ENCOUNTER — Ambulatory Visit (HOSPITAL_COMMUNITY)
Admission: EM | Admit: 2019-02-20 | Discharge: 2019-02-20 | Disposition: A | Discharge status: Home / Self Care | Payer: Medicaid Other | Attending: Family Medicine | Admitting: Family Medicine

## 2019-02-20 DIAGNOSIS — K30 Functional dyspepsia: Secondary | ICD-10-CM | POA: Diagnosis not present

## 2019-02-20 NOTE — ED Notes (Signed)
Patient verbalizes understanding of discharge instructions. Opportunity for questioning and answers were provided. Patient discharged from [facility] by MD. 

## 2019-02-20 NOTE — ED Triage Notes (Signed)
Patient presents to Urgent Care with complaints of vomiting x1 today prior to going to work. Patient states he feels fine now and has been able to tolerate PO, but needs a note saying it is ok for him to return to work.

## 2019-02-20 NOTE — ED Provider Notes (Signed)
[facility]   [id number] 02/20/19 Arrival Time: 1338  ASSESSMENT & PLAN:  1. Stomach upset    Feeling better after eating breakfast. No lingering symptoms. Work note given.  Reviewed expectations re: course of current medical issues. Questions answered. Outlined signs and symptoms indicating need for more acute intervention. Patient verbalized understanding. After Visit Summary given.   SUBJECTIVE: History from: patient.  Garrett Carrillo is a 28 y.o. male who reports waking this morning with a slightly upset stomach. Describes mild cramping discomfort; epigastric. One episode of dry heaves lasting a few minutes. Able to eat breakfast and now feels completely better/normal. Unable to return to work without work note. Afebrile. No sick contacts. No recent travel. No OTC tx needed.  Past Surgical History:  Procedure Laterality Date  . eardrum repair Right   . thumb surgery Right 2009   ROS: As per HPI.  OBJECTIVE:  Vitals:   02/20/19 1359  BP: 119/73  Pulse: 78  Resp: 17  Temp: 98.6 F (37 C)  TempSrc: Oral  SpO2: 100%    General appearance: alert; no distress Oropharynx: ,oist Lungs: clear to auscultation bilaterally; unlabored Heart: regular rate and rhythm Abdomen: soft; non-distended; no significant abdominal tenderness Back: no CVA tenderness Extremities: no edema; symmetrical with no gross deformities Skin: warm; dry Neurologic: normal gait Psychological: alert and cooperative; normal mood and affect  Allergies  Allergen Reactions  . Penicillins Anaphylaxis  . Sulfa Antibiotics Other (See Comments)                                               Past Medical History:  Diagnosis Date  . ADHD (attention deficit hyperactivity disorder)   . Depression   . Suicide attempt [facility])    Social History   Socioeconomic History  . Marital status: Single    Spouse name: Not on file  . Number of children: Not on file  . Years of education: Not on file   . Highest education level: Not on file  Occupational History  . Not on file  Social Needs  . Financial resource strain: Not on file  . Food insecurity:    Worry: Not on file    Inability: Not on file  . Transportation needs:    Medical: Not on file    Non-medical: Not on file  Tobacco Use  . Smoking status: Current Every Day Smoker    Packs/day: 1.00    Years: 10.00    Pack years: 10.00    Types: Cigarettes  . Smokeless tobacco: Never Used  Substance and Sexual Activity  . Alcohol use: Yes    Comment: occasional  . Drug use: Yes    Types: Marijuana, Benzodiazepines    Comment: none since Dec. 2014; buys xanax off the street  . Sexual activity: Yes    Birth control/protection: None  Lifestyle  . Physical activity:    Days per week: Not on file    Minutes per session: Not on file  . Stress: Not on file  Relationships  . Social connections:    Talks on phone: Not on file    Gets together: Not on file    Attends religious service: Not on file    Active member of club or organization: Not on file    Attends meetings of clubs or organizations: Not on file  Relationship status: Not on file  . Intimate partner violence:    Fear of current or ex partner: Not on file    Emotionally abused: Not on file    Physically abused: Not on file    Forced sexual activity: Not on file  Other Topics Concern  . Not on file  Social History Narrative  . Not on file   Family History  Adopted: Yes  Problem Relation Age of Onset  . Hypertension Mother   . Hypertension Maternal Grandmother   . Hypertension Maternal Grandfather   . Hypertension Paternal Grandmother   . Hypertension Paternal Glynda Jaeger, MD 02/20/19 [phone]

## 2019-03-17 ENCOUNTER — Other Ambulatory Visit: Payer: Self-pay

## 2019-03-17 ENCOUNTER — Emergency Department (HOSPITAL_COMMUNITY): Payer: Medicaid Other

## 2019-03-17 ENCOUNTER — Emergency Department (HOSPITAL_COMMUNITY)
Admission: EM | Admit: 2019-03-17 | Discharge: 2019-03-18 | Disposition: A | Discharge status: Home / Self Care | Payer: Medicaid Other | Attending: Emergency Medicine | Admitting: Emergency Medicine

## 2019-03-17 ENCOUNTER — Encounter (HOSPITAL_COMMUNITY): Payer: Self-pay | Admitting: Emergency Medicine

## 2019-03-17 DIAGNOSIS — Z79899 Other long term (current) drug therapy: Secondary | ICD-10-CM | POA: Diagnosis not present

## 2019-03-17 DIAGNOSIS — L539 Erythematous condition, unspecified: Secondary | ICD-10-CM | POA: Diagnosis present

## 2019-03-17 DIAGNOSIS — L02213 Cutaneous abscess of chest wall: Secondary | ICD-10-CM | POA: Diagnosis not present

## 2019-03-17 DIAGNOSIS — F909 Attention-deficit hyperactivity disorder, unspecified type: Secondary | ICD-10-CM | POA: Diagnosis not present

## 2019-03-17 DIAGNOSIS — F17210 Nicotine dependence, cigarettes, uncomplicated: Secondary | ICD-10-CM | POA: Insufficient documentation

## 2019-03-17 NOTE — ED Triage Notes (Signed)
Pt present to ED c/o painful lump on right chest for two days. Pain that gets worse with movement 8/10. Pt reports no injury to the chest, no n/v. Pt used icey-hot that helped with the pain. Pt was tested for COVID19 d/t outbreak in his job and tested negative. Although people he works with have tested positive.

## 2019-03-18 MED ORDER — CLINDAMYCIN HCL 150 MG PO CAPS: 150.0000 mg | ORAL_CAPSULE | Freq: Three times a day (TID) | ORAL | 0 refills | Status: DC

## 2019-03-18 NOTE — ED Notes (Signed)
Painful small abscess to chest

## 2019-03-18 NOTE — Discharge Instructions (Addendum)
Soak twice daily. Take antibiotics as directed. Return for fevers, spreading redness/warmth, enlargement of the area or new concerns.
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
## 2019-03-21 ENCOUNTER — Other Ambulatory Visit: Payer: Self-pay

## 2019-03-21 ENCOUNTER — Encounter (HOSPITAL_COMMUNITY): Payer: Self-pay | Admitting: Emergency Medicine

## 2019-03-21 ENCOUNTER — Emergency Department (HOSPITAL_COMMUNITY)
Admission: EM | Admit: 2019-03-21 | Discharge: 2019-03-22 | Disposition: A | Discharge status: Home / Self Care | Payer: Medicaid Other | Attending: Emergency Medicine | Admitting: Emergency Medicine

## 2019-03-21 DIAGNOSIS — R451 Restlessness and agitation: Secondary | ICD-10-CM | POA: Diagnosis not present

## 2019-03-21 DIAGNOSIS — Z79899 Other long term (current) drug therapy: Secondary | ICD-10-CM | POA: Diagnosis not present

## 2019-03-21 DIAGNOSIS — F909 Attention-deficit hyperactivity disorder, unspecified type: Secondary | ICD-10-CM | POA: Diagnosis not present

## 2019-03-21 DIAGNOSIS — R0602 Shortness of breath: Secondary | ICD-10-CM | POA: Insufficient documentation

## 2019-03-21 DIAGNOSIS — F17210 Nicotine dependence, cigarettes, uncomplicated: Secondary | ICD-10-CM | POA: Diagnosis not present

## 2019-03-21 DIAGNOSIS — R51 Headache: Secondary | ICD-10-CM | POA: Insufficient documentation

## 2019-03-21 DIAGNOSIS — L720 Epidermal cyst: Secondary | ICD-10-CM | POA: Insufficient documentation

## 2019-03-21 DIAGNOSIS — R222 Localized swelling, mass and lump, trunk: Secondary | ICD-10-CM | POA: Diagnosis present

## 2019-03-21 MED ORDER — KETOROLAC TROMETHAMINE 60 MG/2ML IM SOLN
60.0000 mg | Freq: Once | INTRAMUSCULAR | Status: AC
  Administered 2019-03-21: 60 mg via INTRAMUSCULAR
  Filled 2019-03-21: qty 2

## 2019-03-21 NOTE — ED Triage Notes (Signed)
Patient with abscess in right chest, pain is worse now and patient unable to move right arm and right leg.  He states that "locked up".  History of anxiety.
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
## 2019-03-21 NOTE — ED Provider Notes (Signed)
[facility]   CSN: [id number] Arrival date & time: 03/21/19  2210    History   Chief Complaint Chief Complaint  Patient presents with  . Abscess    HPI Torrez L Daisy LazarCamack is a 28 y.o. male.     28 year old male with a history of ADHD, depression, cannabis use disorder presents to the emergency department for multiple complaints.  Was seen on 03/17/2019 for complaints of a lump on his chest x2 days.  During his assessment today, reports that this lump has been present x2 weeks.  It continues to bother him and cause him pain.  States that the discomfort makes him feel short of breath at times.  He has been compliant with the course of antibiotics prescribed to him.  Reports that he came back to the ED today for persistent pain which has been unrelieved with ibuprofen.  Also notes that the right side of his body "locked up".  He has been unable to use it.  This began after leaving work.  Later goes on to endorse a right-sided headache.  He states that he has had this headache for a few days, possibly 1 week.  History became more difficult at this time as patient was evasive with questioning and increasingly agitated.  He does state that he feels that his right parietal headache is worse today.  Denies associated IVDU, fever, increased redness to suspected cyst/abscess site.  Attempted to have area drained at prior ED visit with only scant material expelled.    Abscess    Past Medical History:  Diagnosis Date  . ADHD (attention deficit hyperactivity disorder)   . Depression   . Suicide attempt [facility](HCC)     Patient Active Problem List   Diagnosis Date Noted  . MDD (major depressive disorder), recurrent severe, without psychosis (HCC) 03/22/2015  . Sedative, hypnotic or anxiolytic use disorder, severe, dependence (HCC) 03/22/2015  . Cannabis use disorder, moderate, dependence (HCC)   . Syncope and collapse   . ADHD, impulsive type  11/23/2014  . Contusion of right hand 03/18/2014    Past Surgical History:  Procedure Laterality Date  . eardrum repair Right   . thumb surgery Right 2009        Home Medications    Prior to Admission medications   Medication Sig Start Date End Date Taking? Authorizing Provider  clindamycin (CLEOCIN) 150 MG capsule Take 1 capsule (150 mg total) by mouth 3 (three) times daily. 03/18/19   Blane OharaZavitz, Joshua, MD  meloxicam (MOBIC) 7.5 MG tablet Take 1 tablet (7.5 mg total) by mouth daily. 05/06/18   Belinda FisherYu, Amy V, PA-C    Family History Family History  Adopted: Yes  Problem Relation Age of Onset  . Hypertension Mother   . Hypertension Maternal Grandmother   . Hypertension Maternal Grandfather   . Hypertension Paternal Grandmother   . Hypertension Paternal Grandfather     Social History Social History   Tobacco Use  . Smoking status: Current Every Day Smoker    Packs/day: 1.00    Years: 10.00    Pack years: 10.00    Types: Cigarettes  . Smokeless tobacco: Never Used  Substance Use Topics  . Alcohol use: Yes    Comment: occasional  . Drug use: Yes    Types: Marijuana, Benzodiazepines    Comment: none since Dec. 2014; buys xanax off the street     Allergies   Penicillins and Sulfa antibiotics   Review of Systems  Review of Systems Ten systems reviewed and are negative for acute change, except as noted in the HPI.    Physical Exam Updated Vital Signs BP 114/68 (BP Location: Left Arm)   Pulse 70   Temp 97.9 F (36.6 C) (Oral)   Resp 18   SpO2 100%   Physical Exam Vitals signs and nursing note reviewed.  Constitutional:      General: He is not in acute distress.    Appearance: He is well-developed. He is not diaphoretic.     Comments: Nontoxic appearing and in NAD  HENT:     Head: Normocephalic and atraumatic.  Eyes:     General: No scleral icterus.    Conjunctiva/sclera: Conjunctivae normal.  Neck:     Musculoskeletal: Normal range of motion.  Pulmonary:      Effort: Pulmonary effort is normal. No respiratory distress.     Comments: Respirations even and unlabored. Suspected epidermal cyst to the right chest wall overlying the right pectoral muscle. No associated erythema, heat to touch, red linear streaking, fluctuance. Musculoskeletal: Normal range of motion.  Skin:    General: Skin is warm and dry.     Coloration: Skin is not pale.     Findings: No erythema or rash.  Neurological:     Mental Status: He is alert and oriented to person, place, and time.     Comments: GCS 15. Speech is goal oriented. Exam limited 2/2 patient cooperation. No gross focal deficits appreciated. Reports "inability to move" right side. Sits with RUE resting on right arm of chair in flexed position at the elbow; right hand continues to hover in the air unassisted. When manually moving the patient's RLE, he keeps it elevated against gravity. Will shake b/l feet when agitated.   Psychiatric:        Speech: Speech normal.        Behavior: Behavior is agitated.     Marthenia Rolling area of cyst just above top of the "s" of the patient's tattoo; lateral to the "d". No erythema, heat to touch.    ED Treatments / Results  Labs (all labs ordered are listed, but only abnormal results are displayed) Labs Reviewed - No data to display  EKG None  Radiology No results found.  Procedures Procedures (including critical care time)  Medications Ordered in ED Medications  ketorolac (TORADOL) injection 60 mg (60 mg Intramuscular Given 03/21/19 2335)    11:30 PM  Prior CXR from 03/17/19 reviewed. Negative.   12:14 AM Patient reporting some symptomatic improvement with Toradol. Has repositioned himself in the bed and is laying on his right side.  12:18 AM Patient standing, upright, unassisted. Ambulatory with steady gait without assistance. Subsequently utilizing his cell phone with use of R hand.   Initial Impression / Assessment and Plan / ED Course  I have reviewed the  triage vital signs and the nursing notes.  Pertinent labs & imaging results that were available during my care of the patient were reviewed by me and considered in my medical decision making (see chart for details).        28 year old male presents for evaluation of persistent pain associated with cyst/abscess.  Was seen a few days ago and attempted to have this drained.  Patient underwent needle aspiration at the bedside with little drainage.  His exam today is consistent with an epidermal cyst without signs of secondary infection.  He is currently on a course of clindamycin.  I have instructed the patient to continue  use of this antibiotic.  History slightly difficult to obtain secondary to patient agitation and tangential history.  Reports poorly characterized right-sided headache.  Also noted to nursing that he was unable to move his right side, but he is neurovascularly intact without focal deficits on my exam.  Later reporting that he is experiencing pain to his right knee.  Endorses this to be aggravated with ambulation.  He has no difficulty ambulating without assistance.  His discomfort was treated in the ED with IM Toradol.  I believe he is appropriate for continued outpatient management.  Encouraged use of ibuprofen.  Return precautions discussed and provided. Patient discharged in stable condition with no unaddressed concerns.   Final Clinical Impressions(s) / ED Diagnoses   Final diagnoses:  Epidermal cyst    ED Discharge Orders    None       Antony Madura, PA-C 03/22/19 0123    Melene Plan, DO 03/24/19 [phone]

## 2019-03-22 NOTE — Discharge Instructions (Signed)
Continue clindamycin as previously prescribed.  We recommend 600 mg ibuprofen every 6 hours for continued pain control.  Follow-up with a primary care doctor to ensure resolution of symptoms.

## 2019-03-22 NOTE — ED Notes (Signed)
Pt ambulated with no difficulty or assistance. Pt kept leg straight while walking and stated "It's still locked up and it hurts that's what I keep trying to tell y'all"

## 2019-07-14 ENCOUNTER — Emergency Department (HOSPITAL_COMMUNITY)
Admission: EM | Admit: 2019-07-14 | Discharge: 2019-07-14 | Disposition: A | Discharge status: Home / Self Care | Payer: Medicaid Other | Attending: Emergency Medicine | Admitting: Emergency Medicine

## 2019-07-14 ENCOUNTER — Ambulatory Visit (HOSPITAL_COMMUNITY)
Admission: EM | Admit: 2019-07-14 | Discharge: 2019-07-14 | Discharge status: Absent without leave | Payer: Medicaid Other

## 2019-07-14 ENCOUNTER — Other Ambulatory Visit: Payer: Self-pay

## 2019-07-14 ENCOUNTER — Encounter (HOSPITAL_COMMUNITY): Payer: Self-pay | Admitting: Emergency Medicine

## 2019-07-14 DIAGNOSIS — H538 Other visual disturbances: Secondary | ICD-10-CM | POA: Diagnosis not present

## 2019-07-14 DIAGNOSIS — F909 Attention-deficit hyperactivity disorder, unspecified type: Secondary | ICD-10-CM | POA: Insufficient documentation

## 2019-07-14 DIAGNOSIS — H53149 Visual discomfort, unspecified: Secondary | ICD-10-CM | POA: Diagnosis not present

## 2019-07-14 DIAGNOSIS — F17210 Nicotine dependence, cigarettes, uncomplicated: Secondary | ICD-10-CM | POA: Diagnosis not present

## 2019-07-14 DIAGNOSIS — R569 Unspecified convulsions: Secondary | ICD-10-CM | POA: Insufficient documentation

## 2019-07-14 DIAGNOSIS — R51 Headache: Secondary | ICD-10-CM | POA: Insufficient documentation

## 2019-07-14 DIAGNOSIS — R454 Irritability and anger: Secondary | ICD-10-CM

## 2019-07-14 DIAGNOSIS — H1133 Conjunctival hemorrhage, bilateral: Secondary | ICD-10-CM | POA: Diagnosis not present

## 2019-07-14 DIAGNOSIS — R55 Syncope and collapse: Secondary | ICD-10-CM | POA: Insufficient documentation

## 2019-07-14 LAB — CBC
HCT: 42.2 % (ref 39.0–52.0)
Hemoglobin: 13.6 g/dL (ref 13.0–17.0)
MCH: 30.1 pg (ref 26.0–34.0)
MCHC: 32.2 g/dL (ref 30.0–36.0)
MCV: 93.4 fL (ref 80.0–100.0)
Platelets: 228 10*3/uL (ref 150–400)
RBC: 4.52 MIL/uL (ref 4.22–5.81)
RDW: 13.6 % (ref 11.5–15.5)
WBC: 6.7 10*3/uL (ref 4.0–10.5)
nRBC: 0 % (ref 0.0–0.2)

## 2019-07-14 LAB — BASIC METABOLIC PANEL
Anion gap: 10 (ref 5–15)
BUN: 14 mg/dL (ref 6–20)
CO2: 23 mmol/L (ref 22–32)
Calcium: 8.7 mg/dL — ABNORMAL LOW (ref 8.9–10.3)
Chloride: 107 mmol/L (ref 98–111)
Creatinine, Ser: 1.1 mg/dL (ref 0.61–1.24)
GFR calc Af Amer: >60 mL/min (ref >60)
GFR calc non Af Amer: >60 mL/min (ref >60)
Glucose, Bld: 96 mg/dL (ref 70–99)
Potassium: 3.9 mmol/L (ref 3.5–5.1)
Sodium: 140 mmol/L (ref 135–145)

## 2019-07-14 MED ORDER — SODIUM CHLORIDE 0.9% FLUSH: 3.0000 mL | Freq: Once | INTRAVENOUS | Status: DC
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
## 2019-07-14 NOTE — ED Notes (Signed)
Patient is being discharged from the [facility] and sent to the Emergency Department. Per Claiborne Billings, NP, patient is stable but in need of higher level of care due to LOC and head trauma resulting in presence of conjunctival hemorrhage. Patient is aware and verbalizes understanding of plan of care.

## 2019-07-14 NOTE — Discharge Instructions (Signed)
Please make an appointment to f/u with neurology

## 2019-07-14 NOTE — ED Notes (Signed)
Pt made aware of the need for urine.

## 2019-07-14 NOTE — ED Notes (Signed)
Patient called for vitals reassessment, no answer.  
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
## 2019-07-14 NOTE — ED Notes (Signed)
Pt verbalizes understanding of d/c instructions. Pt ambulatory at d/c with all belongings.   

## 2019-08-21 ENCOUNTER — Ambulatory Visit: Payer: Medicaid Other | Admitting: Neurology

## 2019-08-21 ENCOUNTER — Other Ambulatory Visit: Payer: Self-pay

## 2019-08-21 ENCOUNTER — Telehealth: Payer: Self-pay | Admitting: Neurology

## 2019-08-21 ENCOUNTER — Encounter: Payer: Self-pay | Admitting: Neurology

## 2019-08-21 VITALS — BP 112/68 | HR 79 | Temp 97.8°F | Ht 73.0 in | Wt 224.0 lb

## 2019-08-21 DIAGNOSIS — R569 Unspecified convulsions: Secondary | ICD-10-CM | POA: Diagnosis not present

## 2019-08-21 DIAGNOSIS — F1210 Cannabis abuse, uncomplicated: Secondary | ICD-10-CM | POA: Diagnosis not present

## 2019-08-21 DIAGNOSIS — G9340 Encephalopathy, unspecified: Secondary | ICD-10-CM

## 2019-08-21 NOTE — Telephone Encounter (Signed)
medicaid order sent to [facility]. They will obtain the auth and reach out to the patient to schedule.  °

## 2019-08-21 NOTE — Patient Instructions (Addendum)
Per [facility] statutes, patients with seizures are not allowed to drive until they have been seizure-free for six months.    Use caution when using heavy equipment or power tools. Avoid working on ladders or at heights. Take showers instead of baths. Ensure the water temperature is not too high on the home water heater. Do not go swimming alone. Do not lock yourself in a room alone (i.e. bathroom). When caring for infants or small children, sit down when holding, feeding, or changing them to minimize risk of injury to the child in the event you have a seizure. Maintain good sleep hygiene. Avoid alcohol.    If patient has another seizure, call 911 and bring them back to the ED if: A.  Any seizure-like activity or alteration of mentation or LOC - especially if > 5 mins.   B.  The patient doesn't wake shortly after the seizure or has new problems such as difficulty seeing, speaking or moving following the seizure C.  The patient was injured during the seizure D.  The patient has a temperature over 102 F (39C) E.  The patient vomited during the seizure and now is having trouble breathing  Per [facility] statutes, patients with seizures are not allowed to drive until they have been seizure-free for six months.  Other recommendations include using caution when using heavy equipment or power tools. Avoid working on ladders or at heights. Take showers instead of baths.  Do not swim alone.  Ensure the water temperature is not too high on the home water heater. Do not go swimming alone. Do not lock yourself in a room alone (i.e. bathroom). When caring for infants or small children, sit down when holding, feeding, or changing them to minimize risk of injury to the child in the event you have a seizure. Maintain good sleep hygiene. Avoid alcohol.  Also recommend adequate sleep, hydration, good diet and minimize stress.  During the Seizure  - First, ensure adequate ventilation and place patients  on the floor on their left side  Loosen clothing around the neck and ensure the airway is patent. If the patient is clenching the teeth, do not force the mouth open with any object as this can cause severe damage - Remove all items from the surrounding that can be hazardous. The patient may be oblivious to what's happening and may not even know what he or she is doing. If the patient is confused and wandering, either gently guide him/her away and block access to outside areas - Reassure the individual and be comforting - Call 911. In most cases, the seizure ends before EMS arrives. However, there are cases when seizures may last over 3 to 5 minutes. Or the individual may have developed breathing difficulties or severe injuries. If a pregnant patient or a person with diabetes develops a seizure, it is prudent to call an ambulance. - Finally, if the patient does not regain full consciousness, then call EMS. Most patients will remain confused for about 45 to 90 minutes after a seizure, so you must use judgment in calling for help. - Avoid restraints but make sure the patient is in a bed with padded side rails - Place the individual in a lateral position with the neck slightly flexed; this will help the saliva drain from the mouth and prevent the tongue from falling backward - Remove all nearby furniture and other hazards from the area - Provide verbal assurance as the individual is regaining consciousness -  Provide the patient with privacy if possible - Call for help and start treatment as ordered by the caregiver   fter the Seizure (Postictal Stage)  After a seizure, most patients experience confusion, fatigue, muscle pain and/or a headache. Thus, one should permit the individual to sleep. For the next few days, reassurance is essential. Being calm and helping reorient the person is also of importance.  Most seizures are painless and end spontaneously. Seizures are not harmful to others but can lead  to complications such as stress on the lungs, brain and the heart. Individuals with prior lung problems may develop labored breathing and respiratory distress.
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
## 2019-08-24 ENCOUNTER — Encounter: Payer: Self-pay | Admitting: Neurology

## 2019-08-24 DIAGNOSIS — F1210 Cannabis abuse, uncomplicated: Secondary | ICD-10-CM | POA: Insufficient documentation

## 2019-09-08 ENCOUNTER — Other Ambulatory Visit: Payer: Medicaid Other

## 2019-09-16 ENCOUNTER — Ambulatory Visit
Admission: RE | Admit: 2019-09-16 | Discharge: 2019-09-16 | Disposition: A | Discharge status: Home / Self Care | Payer: Medicaid Other | Source: Ambulatory Visit | Attending: Neurology | Admitting: Neurology

## 2019-09-16 ENCOUNTER — Other Ambulatory Visit: Payer: Self-pay

## 2019-09-16 DIAGNOSIS — G9340 Encephalopathy, unspecified: Secondary | ICD-10-CM

## 2019-09-16 DIAGNOSIS — R569 Unspecified convulsions: Secondary | ICD-10-CM

## 2019-09-16 MED ORDER — GADOBUTROL 1 MMOL/ML IV SOLN
10.0000 mL | Freq: Once | INTRAVENOUS | Status: AC | PRN
  Administered 2019-09-16: 18:00:00 10 mL via INTRAVENOUS

## 2019-09-18 ENCOUNTER — Telehealth: Payer: Self-pay | Admitting: Neurology

## 2019-09-18 NOTE — Telephone Encounter (Signed)
-----   Message from Melvenia Beam, MD sent at 09/17/2019  7:09 PM EDT ----- MRI brain is normal

## 2019-09-18 NOTE — Telephone Encounter (Signed)
Called and made he patient aware that his MRI was normal. There was no concerns. Pt verbalized understanding. Pt had no questions at this time but was encouraged to call back if questions arise.

## 2019-09-24 ENCOUNTER — Ambulatory Visit (INDEPENDENT_AMBULATORY_CARE_PROVIDER_SITE_OTHER): Payer: Medicaid Other

## 2019-09-24 ENCOUNTER — Other Ambulatory Visit: Payer: Self-pay

## 2019-09-24 DIAGNOSIS — R569 Unspecified convulsions: Secondary | ICD-10-CM | POA: Diagnosis not present

## 2019-09-24 DIAGNOSIS — G9340 Encephalopathy, unspecified: Secondary | ICD-10-CM

## 2019-09-30 ENCOUNTER — Ambulatory Visit: Payer: Medicaid Other | Admitting: Neurology
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
## 2019-10-01 ENCOUNTER — Telehealth: Payer: Self-pay | Admitting: *Deleted

## 2019-10-01 NOTE — Telephone Encounter (Signed)
Spoke with the patient and discussed results of EEG. He understands it is normal and that Dr. Jaynee Eagles recommends 72 hour EEG. Pt is agreeable to this but d/t his current living arrangements he would he would like to have it somewhere else.

## 2019-10-01 NOTE — Telephone Encounter (Signed)
-----   Message from Melvenia Beam, MD sent at 09/30/2019 12:36 PM EST ----- EEG is normal. I would recommend a 72-hour EEG for patient next. Please ensure it is still ok with patient. We can monitor him at home. thanks

## 2019-10-02 ENCOUNTER — Other Ambulatory Visit: Payer: Self-pay | Admitting: Neurology

## 2019-10-02 DIAGNOSIS — R569 Unspecified convulsions: Secondary | ICD-10-CM

## 2019-10-02 NOTE — Telephone Encounter (Signed)
The pt returned my call. He had not received the message. I discussed the message with him and he agreed to proceed with admission to EMU. He understands he will receive a call from their team to schedule. He verbalized appreciation.

## 2019-10-02 NOTE — Telephone Encounter (Signed)
Dr. Claudell Kyle, will you please review my note and let me know if he is appropriate for EMU admission? He cannot have an ambulatory long-term EEG because of his home situation. Thank you !

## 2019-10-02 NOTE — Telephone Encounter (Signed)
Per Dr. Hortense Ramal, pt has been accepted to EMU. Referral placed by Dr. Jaynee Eagles. I called the pt and LVM (ok per DPR) advising the pt that instead of having an EEG at home over a few days, we will have him go into the hospital to the epilepsy monitoring unit for evaluation and they will be calling him to schedule. I asked for a call back to confirm receipt of message and advised him of the office hours and office number.

## 2019-10-06 ENCOUNTER — Other Ambulatory Visit: Payer: Self-pay

## 2019-10-06 ENCOUNTER — Ambulatory Visit (HOSPITAL_COMMUNITY)
Admission: EM | Admit: 2019-10-06 | Discharge: 2019-10-06 | Disposition: A | Discharge status: Home / Self Care | Payer: Medicaid Other | Attending: Internal Medicine | Admitting: Internal Medicine

## 2019-10-06 ENCOUNTER — Encounter (HOSPITAL_COMMUNITY): Payer: Self-pay

## 2019-10-06 DIAGNOSIS — F1211 Cannabis abuse, in remission: Secondary | ICD-10-CM | POA: Diagnosis not present

## 2019-10-06 DIAGNOSIS — R05 Cough: Secondary | ICD-10-CM | POA: Insufficient documentation

## 2019-10-06 DIAGNOSIS — J069 Acute upper respiratory infection, unspecified: Secondary | ICD-10-CM | POA: Insufficient documentation

## 2019-10-06 DIAGNOSIS — Z20828 Contact with and (suspected) exposure to other viral communicable diseases: Secondary | ICD-10-CM | POA: Diagnosis not present

## 2019-10-06 DIAGNOSIS — Z915 Personal history of self-harm: Secondary | ICD-10-CM | POA: Insufficient documentation

## 2019-10-06 DIAGNOSIS — B9789 Other viral agents as the cause of diseases classified elsewhere: Secondary | ICD-10-CM

## 2019-10-06 DIAGNOSIS — Z880 Allergy status to penicillin: Secondary | ICD-10-CM | POA: Insufficient documentation

## 2019-10-06 DIAGNOSIS — Z79899 Other long term (current) drug therapy: Secondary | ICD-10-CM | POA: Diagnosis not present

## 2019-10-06 DIAGNOSIS — Z791 Long term (current) use of non-steroidal anti-inflammatories (NSAID): Secondary | ICD-10-CM | POA: Diagnosis not present

## 2019-10-06 DIAGNOSIS — J029 Acute pharyngitis, unspecified: Secondary | ICD-10-CM

## 2019-10-06 DIAGNOSIS — F17210 Nicotine dependence, cigarettes, uncomplicated: Secondary | ICD-10-CM | POA: Insufficient documentation

## 2019-10-06 DIAGNOSIS — Z882 Allergy status to sulfonamides status: Secondary | ICD-10-CM | POA: Insufficient documentation

## 2019-10-06 LAB — POCT RAPID STREP A: Streptococcus, Group A Screen (Direct): NEGATIVE

## 2019-10-06 MED ORDER — PSEUDOEPH-BROMPHEN-DM 30-2-10 MG/5ML PO SYRP: 5.0000 mL | ORAL_SOLUTION | Freq: Four times a day (QID) | ORAL | 0 refills | Status: DC | PRN

## 2019-10-06 MED ORDER — CETIRIZINE HCL 10 MG PO CAPS: 10.0000 mg | ORAL_CAPSULE | Freq: Every day | ORAL | 0 refills | Status: DC

## 2019-10-06 MED ORDER — FLUTICASONE PROPIONATE 50 MCG/ACT NA SUSP: 1.0000 | Freq: Every day | NASAL | 0 refills | Status: DC

## 2019-10-06 MED ORDER — IBUPROFEN 800 MG PO TABS: 800.0000 mg | ORAL_TABLET | Freq: Three times a day (TID) | ORAL | 0 refills | Status: DC

## 2019-10-06 NOTE — ED Provider Notes (Signed)
[facility]    CSN: [id number] Arrival date & time: 10/06/19  1412      History   Chief Complaint Chief Complaint  Patient presents with  . Sore Throat    HPI Garrett Carrillo is a 28 y.o. male history of depression, ADHD, presenting today for evaluation of sore throat and cough.  Patient states that symptoms began approximately 2 to 3 days ago.  He has had some discomfort in his chest with coughing.  Has some shortness of breath with coughing as well.  Denies any known fevers, but has had some chills and body aches.  He denies any known exposure to Covid.  Denies close contacts that have been sick.  Smokes approximately half pack per day.   HPI  Past Medical History:  Diagnosis Date  . ADHD (attention deficit hyperactivity disorder)   . Cannabis abuse   . Depression   . Suicide attempt [facility])     Patient Active Problem List   Diagnosis Date Noted  . Cannabis abuse   . MDD (major depressive disorder), recurrent severe, without psychosis (HCC) 03/22/2015  . Sedative, hypnotic or anxiolytic use disorder, severe, dependence (HCC) 03/22/2015  . Cannabis use disorder, moderate, dependence (HCC)   . Syncope and collapse   . ADHD, impulsive type 11/23/2014  . Contusion of right hand 03/18/2014    Past Surgical History:  Procedure Laterality Date  . eardrum repair Right   . thumb surgery Right 2009       Home Medications    Prior to Admission medications   Medication Sig Start Date End Date Taking? Authorizing Provider  brompheniramine-pseudoephedrine-DM 30-2-10 MG/5ML syrup Take 5 mLs by mouth 4 (four) times daily as needed. 10/06/19   Rhylee Nunn C, PA-C  Cetirizine HCl 10 MG CAPS Take 1 capsule (10 mg total) by mouth daily for 10 days. 10/06/19 10/16/19  Liat Mayol C, PA-C  fluticasone (FLONASE) 50 MCG/ACT nasal spray Place 1-2 sprays into both nostrils daily for 7 days. 10/06/19 10/13/19  Xzaviar Maloof C, PA-C  ibuprofen (ADVIL) 800 MG tablet  Take 1 tablet (800 mg total) by mouth 3 (three) times daily. 10/06/19   Shyann Hefner, Junius Creamer, PA-C    Family History Family History  Adopted: Yes  Problem Relation Age of Onset  . Hypertension Mother   . Hypertension Maternal Grandmother   . Hypertension Maternal Grandfather   . Hypertension Paternal Grandmother   . Hypertension Paternal Grandfather   . Seizures Neg Hx     Social History Social History   Tobacco Use  . Smoking status: Current Every Day Smoker    Packs/day: 1.00    Years: 10.00    Pack years: 10.00    Types: Cigarettes  . Smokeless tobacco: Never Used  Substance Use Topics  . Alcohol use: Yes    Comment: occasional  . Drug use: Not Currently    Types: Marijuana, Benzodiazepines    Comment: Marijuana occasionally. no xanax since Dec. 2014; buys xanax off the street     Allergies   Penicillins and Sulfa antibiotics   Review of Systems Review of Systems  Constitutional: Positive for chills and fatigue. Negative for activity change, appetite change and fever.  HENT: Positive for congestion, rhinorrhea and sore throat. Negative for ear pain, sinus pressure and trouble swallowing.   Eyes: Negative for discharge and redness.  Respiratory: Positive for cough. Negative for chest tightness and shortness of breath.   Cardiovascular: Negative for chest pain.  Gastrointestinal: Negative for  abdominal pain, diarrhea, nausea and vomiting.  Musculoskeletal: Positive for myalgias.  Skin: Negative for rash.  Neurological: Negative for dizziness, light-headedness and headaches.     Physical Exam Triage Vital Signs ED Triage Vitals  Enc Vitals Group     BP 10/06/19 1557 90/66     Pulse Rate 10/06/19 1557 92     Resp 10/06/19 1557 16     Temp 10/06/19 1557 98.2 F (36.8 C)     Temp Source 10/06/19 1557 Oral     SpO2 10/06/19 1557 99 %     Weight --      Height --      Head Circumference --      Peak Flow --      Pain Score 10/06/19 1555 1     Pain Loc --       Pain Edu? --      Excl. in [place]? --    No data found.  Updated Vital Signs BP 127/69 (BP Location: Right Arm)   Pulse 92   Temp 98.2 F (36.8 C) (Oral)   Resp 16   SpO2 99%   Visual Acuity Right Eye Distance:   Left Eye Distance:   Bilateral Distance:    Right Eye Near:   Left Eye Near:    Bilateral Near:     Physical Exam Vitals signs and nursing note reviewed.  Constitutional:      Appearance: He is well-developed.  HENT:     Head: Normocephalic and atraumatic.     Ears:     Comments: Bilateral ears without tenderness to palpation of external auricle, tragus and mastoid, EAC's without erythema or swelling, TM's with good bony landmarks and cone of light. Non erythematous.     Mouth/Throat:     Comments: Oral mucosa pink and moist, no tonsillar enlargement or exudate. Posterior pharynx patent and nonerythematous, no uvula deviation or swelling. Normal phonation.  Eyes:     Conjunctiva/sclera: Conjunctivae normal.  Neck:     Musculoskeletal: Neck supple.  Cardiovascular:     Rate and Rhythm: Normal rate and regular rhythm.     Heart sounds: No murmur.  Pulmonary:     Effort: Pulmonary effort is normal. No respiratory distress.     Breath sounds: Normal breath sounds.     Comments: Breathing comfortably at rest, CTABL, no wheezing, rales or other adventitious sounds auscultated  Abdominal:     Palpations: Abdomen is soft.     Tenderness: There is no abdominal tenderness.  Skin:    General: Skin is warm and dry.  Neurological:     Mental Status: He is alert.      UC Treatments / Results  Labs (all labs ordered are listed, but only abnormal results are displayed) Labs Reviewed  NOVEL CORONAVIRUS, NAA (HOSP ORDER, SEND-OUT TO REF LAB; TAT 18-24 HRS)  CULTURE, GROUP A STREP [facility])  POCT RAPID STREP A    EKG   Radiology No results found.  Procedures Procedures (including critical care time)  Medications Ordered in UC Medications - No data to  display  Initial Impression / Assessment and Plan / UC Course  I have reviewed the triage vital signs and the nursing notes.  Pertinent labs & imaging results that were available during my care of the patient were reviewed by me and considered in my medical decision making (see chart for details).     Covid swab pending.  Strep test negative.  Most likely viral URI.  Recommending symptomatic  and supportive care.  Push fluids.  Zyrtec and Flonase for congestion and sinus inflammation, cough syrup as needed for cough and congestion.  Ibuprofen and Tylenol for body aches and chills.  Rest and push fluids.  Vital signs stable, lungs clear.  Discussed strict return precautions. Patient verbalized understanding and is agreeable with plan.  Final Clinical Impressions(s) / UC Diagnoses   Final diagnoses:  Viral URI with cough     Discharge Instructions     Covid swab pending, monitor my chart for results; please stay home until results return Symptoms are most likely viral and should resolve over the next week Strep test was negative Please use Tylenol and ibuprofen for body aches, headache, sore throat and any fevers Begin daily cetirizine to help with congestion and drainage that may be causing throat pain Flonase nasal spray 1 to 2 spray in each nostril daily to help with opening sinuses Cough syrup as needed every 8 hours for cough/congestion Please rest and drink plenty of fluids  Follow-up if symptoms not resolving or worsening   ED Prescriptions    Medication Sig Dispense Auth. Provider   fluticasone (FLONASE) 50 MCG/ACT nasal spray Place 1-2 sprays into both nostrils daily for 7 days. 1 g Jeshua Ransford C, PA-C   Cetirizine HCl 10 MG CAPS Take 1 capsule (10 mg total) by mouth daily for 10 days. 10 capsule Daiden Coltrane C, PA-C   ibuprofen (ADVIL) 800 MG tablet Take 1 tablet (800 mg total) by mouth 3 (three) times daily. 21 tablet Azhar Knope C, PA-C    brompheniramine-pseudoephedrine-DM 30-2-10 MG/5ML syrup Take 5 mLs by mouth 4 (four) times daily as needed. 120 mL Helma Argyle, Boones MillHallie C, PA-C     PDMP not reviewed this encounter.   Lew DawesWieters, Alyas Creary C, [place]PA-C 10/06/19 2156

## 2019-10-06 NOTE — Discharge Instructions (Signed)
Covid swab pending, monitor my chart for results; please stay home until results return Symptoms are most likely viral and should resolve over the next week Strep test was negative Please use Tylenol and ibuprofen for body aches, headache, sore throat and any fevers Begin daily cetirizine to help with congestion and drainage that may be causing throat pain Flonase nasal spray 1 to 2 spray in each nostril daily to help with opening sinuses Cough syrup as needed every 8 hours for cough/congestion Please rest and drink plenty of fluids  Follow-up if symptoms not resolving or worsening

## 2019-10-06 NOTE — ED Triage Notes (Signed)
Patient presents to Urgent Care with complaints of sore throat since 2 nights ago and a cough. Patient reports it hurts his chest when he coughs, has not been taking medicine for his sx.

## 2019-10-08 LAB — NOVEL CORONAVIRUS, NAA (HOSP ORDER, SEND-OUT TO REF LAB; TAT 18-24 HRS): SARS-CoV-2, NAA: NOT DETECTED

## 2019-10-09 LAB — CULTURE, GROUP A STREP (THRC)

## 2019-10-31 ENCOUNTER — Other Ambulatory Visit (HOSPITAL_COMMUNITY)
Admission: RE | Admit: 2019-10-31 | Discharge: 2019-10-31 | Disposition: A | Discharge status: Home / Self Care | Payer: Medicaid Other | Source: Ambulatory Visit | Attending: Neurology | Admitting: Neurology

## 2019-10-31 DIAGNOSIS — Z01812 Encounter for preprocedural laboratory examination: Secondary | ICD-10-CM | POA: Insufficient documentation

## 2019-10-31 DIAGNOSIS — Z20828 Contact with and (suspected) exposure to other viral communicable diseases: Secondary | ICD-10-CM | POA: Diagnosis not present

## 2019-10-31 LAB — SARS CORONAVIRUS 2 (TAT 6-24 HRS): SARS Coronavirus 2: NEGATIVE

## 2019-11-03 ENCOUNTER — Inpatient Hospital Stay (HOSPITAL_COMMUNITY)
Admission: RE | Admit: 2019-11-03 | Discharge: 2019-11-05 | DRG: 880 | Disposition: A | Discharge status: Home / Self Care | Payer: Medicaid Other | Attending: Neurology | Admitting: Neurology

## 2019-11-03 ENCOUNTER — Inpatient Hospital Stay (HOSPITAL_COMMUNITY): Payer: Medicaid Other

## 2019-11-03 ENCOUNTER — Other Ambulatory Visit: Payer: Self-pay

## 2019-11-03 DIAGNOSIS — F909 Attention-deficit hyperactivity disorder, unspecified type: Secondary | ICD-10-CM | POA: Diagnosis present

## 2019-11-03 DIAGNOSIS — F410 Panic disorder [episodic paroxysmal anxiety] without agoraphobia: Secondary | ICD-10-CM | POA: Diagnosis not present

## 2019-11-03 DIAGNOSIS — W19XXXA Unspecified fall, initial encounter: Secondary | ICD-10-CM | POA: Diagnosis present

## 2019-11-03 DIAGNOSIS — F329 Major depressive disorder, single episode, unspecified: Secondary | ICD-10-CM | POA: Diagnosis present

## 2019-11-03 DIAGNOSIS — F17210 Nicotine dependence, cigarettes, uncomplicated: Secondary | ICD-10-CM | POA: Diagnosis present

## 2019-11-03 DIAGNOSIS — R569 Unspecified convulsions: Secondary | ICD-10-CM | POA: Diagnosis present

## 2019-11-03 DIAGNOSIS — F430 Acute stress reaction: Principal | ICD-10-CM | POA: Diagnosis present

## 2019-11-03 DIAGNOSIS — F17200 Nicotine dependence, unspecified, uncomplicated: Secondary | ICD-10-CM | POA: Diagnosis present

## 2019-11-03 DIAGNOSIS — F1210 Cannabis abuse, uncomplicated: Secondary | ICD-10-CM | POA: Diagnosis not present

## 2019-11-03 LAB — CBC WITH DIFFERENTIAL/PLATELET
Abs Immature Granulocytes: 0.03 10*3/uL (ref 0.00–0.07)
Basophils Absolute: 0 10*3/uL (ref 0.0–0.1)
Basophils Relative: 0 %
Eosinophils Absolute: 0.1 10*3/uL (ref 0.0–0.5)
Eosinophils Relative: 2 %
HCT: 43 % (ref 39.0–52.0)
Hemoglobin: 14.1 g/dL (ref 13.0–17.0)
Immature Granulocytes: 1 %
Lymphocytes Relative: 35 %
Lymphs Abs: 1.9 10*3/uL (ref 0.7–4.0)
MCH: 30.1 pg (ref 26.0–34.0)
MCHC: 32.8 g/dL (ref 30.0–36.0)
MCV: 91.7 fL (ref 80.0–100.0)
Monocytes Absolute: 0.7 10*3/uL (ref 0.1–1.0)
Monocytes Relative: 13 %
Neutro Abs: 2.8 10*3/uL (ref 1.7–7.7)
Neutrophils Relative %: 49 %
Platelets: 238 10*3/uL (ref 150–400)
RBC: 4.69 MIL/uL (ref 4.22–5.81)
RDW: 13.8 % (ref 11.5–15.5)
WBC: 5.6 10*3/uL (ref 4.0–10.5)
nRBC: 0 % (ref 0.0–0.2)

## 2019-11-03 LAB — COMPREHENSIVE METABOLIC PANEL
ALT: 15 U/L (ref 0–44)
AST: 19 U/L (ref 15–41)
Albumin: 3.9 g/dL (ref 3.5–5.0)
Alkaline Phosphatase: 54 U/L (ref 38–126)
Anion gap: 9 (ref 5–15)
BUN: 12 mg/dL (ref 6–20)
CO2: 27 mmol/L (ref 22–32)
Calcium: 8.9 mg/dL (ref 8.9–10.3)
Chloride: 106 mmol/L (ref 98–111)
Creatinine, Ser: 1.08 mg/dL (ref 0.61–1.24)
GFR calc Af Amer: >60 mL/min (ref >60)
GFR calc non Af Amer: >60 mL/min (ref >60)
Glucose, Bld: 94 mg/dL (ref 70–99)
Potassium: 4.1 mmol/L (ref 3.5–5.1)
Sodium: 142 mmol/L (ref 135–145)
Total Bilirubin: 0.9 mg/dL (ref 0.3–1.2)
Total Protein: 6.2 g/dL — ABNORMAL LOW (ref 6.5–8.1)

## 2019-11-03 LAB — MAGNESIUM: Magnesium: 1.9 mg/dL (ref 1.7–2.4)

## 2019-11-03 LAB — PROTIME-INR
INR: 1 (ref 0.8–1.2)
Prothrombin Time: 13.5 seconds (ref 11.4–15.2)

## 2019-11-03 LAB — PHOSPHORUS: Phosphorus: 3.8 mg/dL (ref 2.5–4.6)

## 2019-11-03 LAB — RAPID URINE DRUG SCREEN, HOSP PERFORMED
Amphetamines: NOT DETECTED
Barbiturates: NOT DETECTED
Benzodiazepines: NOT DETECTED
Cocaine: NOT DETECTED
Opiates: NOT DETECTED
Tetrahydrocannabinol: POSITIVE — AB

## 2019-11-03 LAB — HIV ANTIBODY (ROUTINE TESTING W REFLEX): HIV Screen 4th Generation wRfx: NONREACTIVE

## 2019-11-03 MED ORDER — LORAZEPAM 2 MG/ML IJ SOLN
2.0000 mg | INTRAMUSCULAR | Status: DC | PRN
  Filled 2019-11-03: qty 1

## 2019-11-03 MED ORDER — NICOTINE 14 MG/24HR TD PT24
14.0000 mg | MEDICATED_PATCH | Freq: Every day | TRANSDERMAL | Status: DC
  Administered 2019-11-03 – 2019-11-05 (×3): 14 mg via TRANSDERMAL
  Filled 2019-11-03 (×3): qty 1

## 2019-11-03 MED ORDER — ACETAMINOPHEN 325 MG PO TABS
650.0000 mg | ORAL_TABLET | ORAL | Status: DC | PRN
  Administered 2019-11-03: 650 mg via ORAL
  Filled 2019-11-03: qty 2

## 2019-11-03 MED ORDER — SODIUM CHLORIDE 0.9% FLUSH
3.0000 mL | Freq: Two times a day (BID) | INTRAVENOUS | Status: DC
  Administered 2019-11-04 – 2019-11-05 (×4): 3 mL via INTRAVENOUS

## 2019-11-03 MED ORDER — LABETALOL HCL 5 MG/ML IV SOLN: 5.0000 mg | INTRAVENOUS | Status: DC | PRN

## 2019-11-03 MED ORDER — ENOXAPARIN SODIUM 40 MG/0.4ML ~~LOC~~ SOLN
40.0000 mg | SUBCUTANEOUS | Status: DC
  Administered 2019-11-03: 40 mg via SUBCUTANEOUS
  Filled 2019-11-03: qty 0.4

## 2019-11-03 NOTE — Progress Notes (Signed)
Patient c/o sharp  Pain in his eyes; headache; medicated prn pain. Vital signs obtained; MD notified.

## 2019-11-03 NOTE — Progress Notes (Signed)
Patient arrived to room from admitting department; patient is alert and oriented; ambulatory; visitor at bedside; oriented to room and unit routine; fall/seizure safety explained.

## 2019-11-03 NOTE — Progress Notes (Signed)
Patient reports headache is resolved. No acute distress; continue to monitor.
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
## 2019-11-04 ENCOUNTER — Encounter (HOSPITAL_COMMUNITY): Payer: Self-pay | Admitting: Neurology

## 2019-11-04 DIAGNOSIS — F17200 Nicotine dependence, unspecified, uncomplicated: Secondary | ICD-10-CM

## 2019-11-04 MED ORDER — ENOXAPARIN SODIUM 40 MG/0.4ML ~~LOC~~ SOLN
40.0000 mg | SUBCUTANEOUS | Status: DC
  Filled 2019-11-04: qty 0.4

## 2019-11-04 NOTE — Procedures (Signed)
Patient Name: Garrett Carrillo  MRN: [id number]  Epilepsy Attending: Lora Havens  Referring Physician/Provider: Dr Zeb Comfort Duration: 11/03/2019 0859 to 11/04/2019 0859   Patient history: 28 year old male with seizure-like episode.  EEG to evaluate for potential epileptogenic city and characterization of spells.  Level of alertness: Awake, sleep  AEDs during EEG study: None  Technical aspects: This EEG study was done with scalp electrodes positioned according to the 10-20 International system of electrode placement. Electrical activity was acquired at a sampling rate of 500Hz  and reviewed with a high frequency filter of 70Hz  and a low frequency filter of 1Hz . EEG data were recorded continuously and digitally stored.   Description: The posterior dominant rhythm consists of 9-10 Hz activity of moderate voltage (25-35 uV) seen predominantly in posterior head regions, symmetric and reactive to eye opening and eye closing.  Sleep was characterized by vertex waves, sleep spindles (12 to 14 Hz), maximal frontocentral, REM sleep. Hyperventilation and photic stimulation were not performed.  Event button was pressed twice accidentally per patient without any concomitant EEG change.  IMPRESSION: This study is within normal limits. No seizures or epileptiform discharges were seen throughout the recording.  Katleen Carraway Barbra Sarks

## 2019-11-04 NOTE — Progress Notes (Signed)
Patient not following directions to stay awake. This nurse has told him multiple times to stay awake and woken him up multiple times upon entering the room. This nurse has also tried to turn on the lights but patient refused. Nurse will continue to monitor. [place]

## 2019-11-04 NOTE — Progress Notes (Addendum)
Subjective: No acute events overnight.  No seizure-like activity.   ROS: negative except above  Examination  Vital signs in last 24 hours: Temp:  [97.6 F (36.4 C)-98.4 F (36.9 C)] 97.6 F (36.4 C) (12/15 0417) Pulse Rate:  [51-69] 51 (12/15 0417) Resp:  [18-20] 18 (12/15 0417) BP: (105-118)/(56-72) 117/67 (12/15 0417) SpO2:  [97 %-100 %] 100 % (12/15 0417)  General: lying in bed, not in apparent distress CVS: pulse-normal rate and rhythm RS: breathing comfortably Extremities: normal   Neuro: MS: Alert, oriented, follows commands CN: pupils equal and reactive,  EOMI, face symmetric, tongue midline, normal sensation over face Motor: 5/5 strength in all 4 extremities Reflexes: 2+ bilaterally over patella, biceps, plantars: flexor Coordination: normal Gait: not tested  Basic Metabolic Panel: Recent Labs  Lab 11/03/19 0910  NA 142  K 4.1  CL 106  CO2 27  GLUCOSE 94  BUN 12  CREATININE 1.08  CALCIUM 8.9  MG 1.9  PHOS 3.8    CBC: Recent Labs  Lab 11/03/19 0910  WBC 5.6  NEUTROABS 2.8  HGB 14.1  HCT 43.0  MCV 91.7  PLT 238     Coagulation Studies: Recent Labs    11/03/19 0910  LABPROT 13.5  INR 1.0    Imaging MRI brain within normal limits   Assessment and plan: 28 year old male with history of panic attacks who is admitted for characterization of spells after a seizure-like episode on 07/13/2019.  Convulsion -No ictal-interictal abnormalities overnight.  Continue video EEG monitoring for characterization of spells and to look for potential epileptogenicity -Patient not on any AEDs at home. -Recommended patient stay awake today.  Will attempt seizure provoking measures including photic stimulation and hyperventilation tomorrow. -Seizure precautions -As needed IV Ativan for generalized tonic-clonic seizure lasting more than 2 minutes, focal seizure lasting more than 5 minutes  Nicotine use disorder -Smoking cessation counseling -Nicotine  patch daily, nicotine gum if needed  Disposition: Pending characterization of spells   I have spent a total of  28 minutes with the patient reviewing hospital notes,  test results, labs and examining the patient as well as establishing an assessment and plan that was discussed personally with the patient.  > 50% of time was spent in direct patient care.

## 2019-11-05 DIAGNOSIS — F410 Panic disorder [episodic paroxysmal anxiety] without agoraphobia: Secondary | ICD-10-CM

## 2019-11-05 DIAGNOSIS — F430 Acute stress reaction: Principal | ICD-10-CM

## 2019-11-05 DIAGNOSIS — F1210 Cannabis abuse, uncomplicated: Secondary | ICD-10-CM

## 2019-11-05 MED ORDER — HYDROXYZINE PAMOATE 25 MG PO CAPS: 25.0000 mg | ORAL_CAPSULE | Freq: Three times a day (TID) | ORAL | 0 refills | Status: DC | PRN

## 2019-11-05 NOTE — Progress Notes (Signed)
vLTM EEG complete. No skin breakdown 

## 2019-11-05 NOTE — Progress Notes (Signed)
Patient is discharging home. Significant other here and can transport home. Discharge instructions went over with patient and significant other. All questions and concerns addressed. Patient advised to get an appointment with psychiatry. IV taken out, tele removed. Patient walked down to front entrance with staff. [place]

## 2019-11-05 NOTE — Procedures (Signed)
Patient Name:Garrett Carrillo AUQ:[id number] Epilepsy Attending:Xuan Mateus Barbra Sarks Referring Physician/Provider:Dr Zeb Comfort Duration:11/05/2019 0859 to 11/05/2019 1130  Patient history:28 year old male with seizure-like episode. EEG to evaluate for potential epileptogenic city and characterization of spells.  Level of alertness:Awake  AEDs during EEG study:None  Technical aspects: This EEG study was done with scalp electrodes positioned according to the 10-20 International system of electrode placement. Electrical activity was acquired at a sampling rate of 500Hz  and reviewed with a high frequency filter of 70Hz  and a low frequency filter of 1Hz . EEG data were recorded continuously and digitally stored.  Description:The posterior dominant rhythm consists of 9-10 Hz activity of moderate voltage (25-35 uV) seen predominantly in posterior head regions, symmetric and reactive to eye opening and eye closing.  One event was captured on 11/05/2019 at around 10:30 AM.  Per patient's girlfriend at bedside they were discussing finances which made the patient anxious.  Patient was then noted to start hyperventilating, generalized shaking.  On my assessment, patient had his eyes closed, was stiff in his right upper extremity, able to squeeze hands with his left upper extremity, but not following other commands.  Concomitant EEG showed normal posterior dominant rhythm.  This episode lasted for over 10 minutes after which patient started opening his eyes and following commands.  He stated that this was similar to his panic attacks.  Physiologic photic driving was not seen during photic stimulation.  No EEG change was seen during hyperventilation.  MPRESSION: This study is within normal limits.  One episode was captured on 11/05/2019 as described above without EEG change and was a nonepileptic event.  No seizures or epileptiform discharges were seen throughout the recording.  [name] Barbra Sarks

## 2019-11-05 NOTE — Discharge Summary (Addendum)
Physician Discharge Summary  Patient ID: Garrett Carrillo MRN: [id number] DOB/AGE: 03-15-1991 28 y.o.  Admit date: 11/03/2019 Discharge date: 11/05/2019  Admission Diagnoses: Seizure  Discharge Diagnoses:  Principal Problem:   Nicotine use disorder Active Problems:   Panic attack as reaction to stress   Discharged Condition: stable  Hospital Course: [name] was admitted to epilepsy monitoring unit for characterization of spells and to assess for potential epileptogenic city.  He had continuous video EEG monitoring from 11/02/2021 11/05/2019.  During this period, he needs EEG showed normal awake and sleep.  One episode was captured on 11/05/2019 during which patient was noted to be hyperventilating, eyes closed, not responding to commands, whole body shaking and stiffening.  Concomitant EEG showed normal posterior dominant rhythm.  This was a nonepileptic event.  On further discussing with patient, he states this is similar to his panic attack that he has had in the past.  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  He would need to follow-up with psychiatry and a/therapist as he has been struggling with panic attacks since middle school.  The semiology of the episode in August, normal EEG as well as MRI and history of panic attacks together make the diagnosis of panic attacks more likely than epilepsy.  However I would still recommend seizure precautions for 6 months including do not drive since the date of last event/until cleared by physician.  Patient will benefit from psychiatry/therapist.  This was discussed with patient.  A prescription for hydroxyzine 25 mg every 8 hours as needed for anxiety was also called in at [facility] pharmacy.   Consults: None  Significant Diagnostic Studies: EMU: This EEG during awake and sleep state was within normal limits.  No seizures or epileptiform discharges were seen during the study.  Treatments: None.  Recommend  follow-up with psychiatry/therapist.  For management of panic attacks  Discharge Exam: Blood pressure 108/74, pulse 91, temperature 97.8 F (36.6 C), temperature source Oral, resp. rate 16, height 6\' 1"  (1.854 m), weight 106 kg, SpO2 97 %.  General: lying in bed, not in apparent distress CVS: pulse-normal rate and rhythm RS: breathing comfortably Extremities: normal   Neuro: MS: Alert, oriented, follows commands CN: pupils equal and reactive,  EOMI, face symmetric, tongue midline, normal sensation over face Motor: 5/5 strength in all 4 extremities Reflexes: 2+ bilaterally over patella, biceps, plantars: flexor Coordination: normal  Disposition: Discharge disposition: 01-Home or Self Care        Allergies as of 11/05/2019      Reactions   Penicillins Anaphylaxis   Sulfa Antibiotics Other (See Comments)      Medication List    STOP taking these medications   brompheniramine-pseudoephedrine-DM 30-2-10 MG/5ML syrup   Cetirizine HCl 10 MG Caps   fluticasone 50 MCG/ACT nasal spray Commonly known as: FLONASE   ibuprofen 800 MG tablet Commonly known as: ADVIL     TAKE these medications   hydrOXYzine 25 MG capsule Commonly known as: Vistaril Take 1 capsule (25 mg total) by mouth every 8 (eight) hours as needed for anxiety.      Follow-up Information    11-16-1973, MD. Schedule an appointment as soon as possible for a visit in 8 week(s).   Specialty: Neurology Contact information: [address] [place] [phone]  Psychiatry: As soon as possible for management of panic attack disorders.           Signed: [phone] 11/05/2019, 1:17  PM    I have spent a total of  40 minutes with the patient reviewing hospital notes,  test results, labs and examining the patient as well as establishing an assessment and plan, coordinating discharge that was discussed personally with the patient.  > 50% of time was spent in direct patient  care.

## 2019-11-05 NOTE — Progress Notes (Signed)
EMU maint no skin breakdown at PR9,FM3,W4,Y6. Pt complains of no sore spots.

## 2019-11-05 NOTE — TOC Transition Note (Signed)
Transition of Care [facility]) - CM/SW Discharge Note   Patient Details  Name: Garrett Carrillo MRN: [id number] Date of Birth: 10-12-1991  [facility] CM/SW Contact:  Pollie Friar, RN Phone Number: 11/05/2019, 1:13 PM   Clinical Narrative:    Pt discharging home with self care. Pt has hospital f/u and transportation home.    Final next level of care: Home/Self Care Barriers to Discharge: No Barriers Identified   Patient Goals and CMS Choice        Discharge Placement                       Discharge Plan and Services                                     Social Determinants of Health (SDOH) Interventions     Readmission Risk Interventions No flowsheet data found.

## 2019-11-05 NOTE — Discharge Instructions (Signed)
You were admitted to epilepsy monitoring unit for characterization of spells.  We were able to capture one of her episodes on 11/05/2019.  There was no EEG change during this episode and therefore this was a nonepileptic event.  You do not need to take any seizure medications with this episode.  However, please schedule an appointment and follow-up with a psychiatrist and/or therapist for further management of suspected panic attacks.  Also, per [facility] law, please do not drive for 6 months.  You can follow-up with Dr. Jaynee Eagles if needed.

## 2019-11-05 NOTE — Progress Notes (Signed)
Called by EEG monitoring at 1030 stated the patient seems agitated. On arrival to room, patient was leaning over side of bed coughing, not following commands. Patient stiffened all extremities. Shaking all extremities. HR in 150s. Dr. Hortense Ramal called to bedside. At 1038 patient followed command to squeeze hand on left side. HR then returning to normal. VS WNL. [place]

## 2019-11-05 NOTE — Procedures (Signed)
Patient Name: Garrett Carrillo  MRN: [id number]  Epilepsy Attending: Lora Havens  Referring Physician/Provider: Dr Zeb Comfort Duration: 11/04/2019 0859 to 11/05/2019 0859   Patient history: 28 year old male with seizure-like episode.  EEG to evaluate for potential epileptogenic city and characterization of spells.  Level of alertness: Awake, sleep   AEDs during EEG study: None  Technical aspects: This EEG study was done with scalp electrodes positioned according to the 10-20 International system of electrode placement. Electrical activity was acquired at a sampling rate of 500Hz  and reviewed with a high frequency filter of 70Hz  and a low frequency filter of 1Hz . EEG data were recorded continuously and digitally stored.   Description: The posterior dominant rhythm consists of 9-10 Hz activity of moderate voltage (25-35 uV) seen predominantly in posterior head regions, symmetric and reactive to eye opening and eye closing.  Sleep was characterized by vertex waves, sleep spindles (12 to 14 Hz), maximal frontocentral, REM sleep. Hyperventilation and photic stimulation were not performed.   IMPRESSION: This study is within normal limits. No seizures or epileptiform discharges were seen throughout the recording.  Agnieszka Newhouse Barbra Sarks

## 2019-11-09 ENCOUNTER — Telehealth: Payer: Self-pay | Admitting: Neurology

## 2019-11-09 NOTE — Telephone Encounter (Signed)
Patient was monitored inpatient. He had one event that was similar to past events and the EEG was found to be normal. Dr. Hortense Ramal believes panic attacks and a non-epileptic etiology more likely than a seizure disorder. She still recommends seizure precautions (no driving etc) until 6 months episode free AND following with psychiatry. Please inform patient. 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 Please set up appointment with Amy in 3 months and then he can be returned to pcp. Thanks

## 2019-11-10 NOTE — Telephone Encounter (Signed)
Pt returned my call. I discussed the message below from Dr. Jaynee Eagles regarding EEG results and plan. Pt verbalized understanding and he scheduled a f/u appt with Amy for Tues 02/03/20 @ 1:00 pm arrival 15-30 minutes prior. His questions were answered. He verbalized appreciation for the call.

## 2019-11-10 NOTE — Telephone Encounter (Signed)
I called the pt & LVM (ok per DPR) asking for a call back to discuss the results of EEG and next steps which involve an appointment being scheduled. Left office number in message & asked for call back as soon as possible. Advised we close Thurs for Christmas.

## 2019-12-15 DIAGNOSIS — Z0389 Encounter for observation for other suspected diseases and conditions ruled out: Secondary | ICD-10-CM | POA: Diagnosis not present

## 2019-12-15 DIAGNOSIS — Z1388 Encounter for screening for disorder due to exposure to contaminants: Secondary | ICD-10-CM | POA: Diagnosis not present

## 2019-12-15 DIAGNOSIS — Z3009 Encounter for other general counseling and advice on contraception: Secondary | ICD-10-CM | POA: Diagnosis not present

## 2019-12-15 DIAGNOSIS — Z114 Encounter for screening for human immunodeficiency virus [HIV]: Secondary | ICD-10-CM | POA: Diagnosis not present

## 2019-12-15 DIAGNOSIS — Z113 Encounter for screening for infections with a predominantly sexual mode of transmission: Secondary | ICD-10-CM | POA: Diagnosis not present

## 2019-12-15 DIAGNOSIS — Z202 Contact with and (suspected) exposure to infections with a predominantly sexual mode of transmission: Secondary | ICD-10-CM | POA: Diagnosis not present

## 2020-02-03 ENCOUNTER — Encounter: Payer: Self-pay | Admitting: Family Medicine

## 2020-02-03 ENCOUNTER — Ambulatory Visit: Payer: Self-pay | Admitting: Family Medicine
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
## 2020-08-23 ENCOUNTER — Other Ambulatory Visit: Payer: Self-pay

## 2020-08-23 ENCOUNTER — Emergency Department (HOSPITAL_COMMUNITY)
Admission: EM | Admit: 2020-08-23 | Discharge: 2020-08-24 | Disposition: A | Discharge status: Home / Self Care | Payer: Medicaid Other | Attending: Emergency Medicine | Admitting: Emergency Medicine

## 2020-08-23 ENCOUNTER — Encounter (HOSPITAL_COMMUNITY): Payer: Self-pay | Admitting: *Deleted

## 2020-08-23 DIAGNOSIS — S161XXA Strain of muscle, fascia and tendon at neck level, initial encounter: Secondary | ICD-10-CM | POA: Insufficient documentation

## 2020-08-23 DIAGNOSIS — F17210 Nicotine dependence, cigarettes, uncomplicated: Secondary | ICD-10-CM | POA: Diagnosis not present

## 2020-08-23 DIAGNOSIS — S345XXA Injury of lumbar, sacral and pelvic sympathetic nerves, initial encounter: Secondary | ICD-10-CM | POA: Diagnosis present

## 2020-08-23 DIAGNOSIS — S39012A Strain of muscle, fascia and tendon of lower back, initial encounter: Secondary | ICD-10-CM | POA: Insufficient documentation
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
## 2020-08-24 ENCOUNTER — Ambulatory Visit (HOSPITAL_COMMUNITY)
Admission: EM | Admit: 2020-08-24 | Discharge: 2020-08-24 | Disposition: A | Discharge status: Home / Self Care | Payer: Medicaid Other | Attending: Family Medicine | Admitting: Family Medicine

## 2020-08-24 ENCOUNTER — Encounter (HOSPITAL_COMMUNITY): Payer: Self-pay

## 2020-08-24 DIAGNOSIS — S39012D Strain of muscle, fascia and tendon of lower back, subsequent encounter: Secondary | ICD-10-CM | POA: Diagnosis not present

## 2020-08-24 DIAGNOSIS — S39012A Strain of muscle, fascia and tendon of lower back, initial encounter: Secondary | ICD-10-CM | POA: Diagnosis not present

## 2020-08-24 DIAGNOSIS — S161XXD Strain of muscle, fascia and tendon at neck level, subsequent encounter: Secondary | ICD-10-CM

## 2020-08-24 DIAGNOSIS — S161XXA Strain of muscle, fascia and tendon at neck level, initial encounter: Secondary | ICD-10-CM | POA: Diagnosis not present

## 2020-08-24 MED ORDER — CYCLOBENZAPRINE HCL 10 MG PO TABS: 10.0000 mg | ORAL_TABLET | Freq: Two times a day (BID) | ORAL | 0 refills | Status: DC | PRN

## 2020-08-24 MED ORDER — PREDNISONE 20 MG PO TABS: 40.0000 mg | ORAL_TABLET | Freq: Every day | ORAL | 0 refills | Status: DC

## 2020-08-24 NOTE — ED Notes (Signed)
Patient verbalizes understanding of discharge instructions. Opportunity for questioning and answers were provided. Pt discharged from ED. 

## 2020-08-24 NOTE — Discharge Instructions (Signed)
You were evaluated in the Emergency Department and after careful evaluation, we did not find any emergent condition requiring admission or further testing in the hospital.  Your exam/testing today was overall reassuring.  Symptoms seem to be due to muscle strain or spasm.  Please continue using Tylenol or ibuprofen at home for discomfort.  You can use the Flexeril medication as needed for more significant pain but please use caution as this medication can cause drowsiness.  We recommend rest for the next few days to allow your muscles to heal.  Please return to the Emergency Department if you experience any worsening of your condition.  Thank you for allowing [place] to be a part of your care.
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
## 2020-12-06 ENCOUNTER — Ambulatory Visit (HOSPITAL_COMMUNITY)
Admission: EM | Admit: 2020-12-06 | Discharge: 2020-12-06 | Disposition: A | Discharge status: Home / Self Care | Payer: Medicaid Other | Attending: Urgent Care | Admitting: Urgent Care

## 2020-12-06 ENCOUNTER — Other Ambulatory Visit: Payer: Self-pay

## 2020-12-06 ENCOUNTER — Encounter (HOSPITAL_COMMUNITY): Payer: Self-pay

## 2020-12-06 DIAGNOSIS — Z20822 Contact with and (suspected) exposure to covid-19: Secondary | ICD-10-CM | POA: Diagnosis not present

## 2020-12-06 DIAGNOSIS — F17210 Nicotine dependence, cigarettes, uncomplicated: Secondary | ICD-10-CM | POA: Insufficient documentation

## 2020-12-06 DIAGNOSIS — R059 Cough, unspecified: Secondary | ICD-10-CM | POA: Diagnosis present

## 2020-12-06 DIAGNOSIS — J069 Acute upper respiratory infection, unspecified: Secondary | ICD-10-CM | POA: Insufficient documentation

## 2020-12-06 DIAGNOSIS — R0981 Nasal congestion: Secondary | ICD-10-CM

## 2020-12-06 DIAGNOSIS — R070 Pain in throat: Secondary | ICD-10-CM

## 2020-12-06 MED ORDER — PSEUDOEPHEDRINE HCL 60 MG PO TABS: 60.0000 mg | ORAL_TABLET | Freq: Three times a day (TID) | ORAL | 0 refills | Status: DC | PRN

## 2020-12-06 MED ORDER — PROMETHAZINE-DM 6.25-15 MG/5ML PO SYRP: 5.0000 mL | ORAL_SOLUTION | Freq: Every evening | ORAL | 0 refills | Status: DC | PRN

## 2020-12-06 MED ORDER — CETIRIZINE HCL 10 MG PO TABS: 10.0000 mg | ORAL_TABLET | Freq: Every day | ORAL | 0 refills | Status: DC

## 2020-12-06 MED ORDER — BENZONATATE 100 MG PO CAPS: 100.0000 mg | ORAL_CAPSULE | Freq: Three times a day (TID) | ORAL | 0 refills | Status: DC | PRN

## 2020-12-06 NOTE — Discharge Instructions (Addendum)

## 2020-12-06 NOTE — ED Triage Notes (Signed)
Pt in with c/o headaches, cough, and runny nose that has been going on for 3 days now after he was around someone who tested positive for covid  Pt has not had medication for sxs

## 2020-12-06 NOTE — ED Provider Notes (Signed)
[facility]   MRN: [id number] DOB: 1991-09-18  Subjective:   Garrett Carrillo is a 30 y.o. male presenting for 3-day history of acute onset sinus headache, cough, runny and stuffy nose.  Patient states that he is vaccinated but wants to make sure he does not have a one of his coworkers tested positive for this.  Denies chest pain, shortness of breath.  No history of lung disorders.  Patient is a marijuana user.  No current facility-administered medications for this encounter.  Current Outpatient Medications:  .  cyclobenzaprine (FLEXERIL) 10 MG tablet, Take 1 tablet (10 mg total) by mouth 2 (two) times daily as needed for muscle spasms., Disp: 20 tablet, Rfl: 0 .  hydrOXYzine (VISTARIL) 25 MG capsule, Take 1 capsule (25 mg total) by mouth every 8 (eight) hours as needed for anxiety., Disp: 30 capsule, Rfl: 0 .  predniSONE (DELTASONE) 20 MG tablet, Take 2 tablets (40 mg total) by mouth daily with breakfast., Disp: 10 tablet, Rfl: 0   Allergies  Allergen Reactions  . Penicillins Anaphylaxis  . Sulfa Antibiotics Other (See Comments)    Past Medical History:  Diagnosis Date  . ADHD (attention deficit hyperactivity disorder)   . Cannabis abuse   . Depression   . Suicide attempt [facility])      Past Surgical History:  Procedure Laterality Date  . eardrum repair Right   . thumb surgery Right 2009    Family History  Adopted: Yes  Problem Relation Age of Onset  . Hypertension Mother   . Hypertension Maternal Grandmother   . Hypertension Maternal Grandfather   . Hypertension Paternal Grandmother   . Hypertension Paternal Grandfather   . Seizures Neg Hx     Social History   Tobacco Use  . Smoking status: Current Every Day Smoker    Packs/day: 1.00    Years: 10.00    Pack years: 10.00    Types: Cigarettes  . Smokeless tobacco: Never Used  Vaping Use  . Vaping Use: Never used  Substance Use Topics  . Alcohol use: Yes    Comment: occasional  . Drug use:  Not Currently    Types: Marijuana, Benzodiazepines    Comment: Marijuana occasionally. no xanax since Dec. 2014; buys xanax off the street    ROS   Objective:   Vitals: BP 120/61 (BP Location: Right Arm)   Pulse 79   Temp 98.4 F (36.9 C) (Oral)   Resp 20   SpO2 100%   Physical Exam Constitutional:      General: He is not in acute distress.    Appearance: Normal appearance. He is well-developed. He is not ill-appearing, toxic-appearing or diaphoretic.  HENT:     Head: Normocephalic and atraumatic.     Right Ear: External ear normal.     Left Ear: External ear normal.     Nose: Nose normal.     Mouth/Throat:     Mouth: Mucous membranes are moist.     Pharynx: Oropharynx is clear.  Eyes:     General: No scleral icterus.    Extraocular Movements: Extraocular movements intact.     Pupils: Pupils are equal, round, and reactive to light.  Cardiovascular:     Rate and Rhythm: Normal rate and regular rhythm.     Heart sounds: Normal heart sounds. No murmur heard. No friction rub. No gallop.   Pulmonary:     Effort: Pulmonary effort is normal. No respiratory distress.     Breath  sounds: Normal breath sounds. No stridor. No wheezing, rhonchi or rales.  Neurological:     Mental Status: He is alert and oriented to person, place, and time.  Psychiatric:        Mood and Affect: Mood normal.        Behavior: Behavior normal.        Thought Content: Thought content normal.     Assessment and Plan :   PDMP not reviewed this encounter.  1. Viral URI with cough   2. Nasal congestion   3. Throat pain     Will manage for viral illness such as viral URI, viral syndrome, viral rhinitis, COVID-19. Counseled patient on nature of COVID-19 including modes of transmission, diagnostic testing, management and supportive care.  Offered scripts for symptomatic relief. COVID 19 testing is pending. Counseled patient on potential for adverse effects with medications prescribed/recommended  today, ER and return-to-clinic precautions discussed, patient verbalized understanding.     Wallis Bamberg, [place] 12/06/20 1914

## 2020-12-07 LAB — SARS CORONAVIRUS 2 (TAT 6-24 HRS): SARS Coronavirus 2: NEGATIVE

## 2021-03-06 DIAGNOSIS — R5383 Other fatigue: Secondary | ICD-10-CM | POA: Diagnosis not present

## 2021-03-06 DIAGNOSIS — U071 COVID-19: Secondary | ICD-10-CM | POA: Diagnosis not present

## 2021-06-20 ENCOUNTER — Encounter (HOSPITAL_COMMUNITY): Payer: Self-pay | Admitting: *Deleted

## 2021-06-20 ENCOUNTER — Other Ambulatory Visit: Payer: Self-pay

## 2021-06-20 ENCOUNTER — Ambulatory Visit (HOSPITAL_COMMUNITY)
Admission: EM | Admit: 2021-06-20 | Discharge: 2021-06-20 | Disposition: A | Discharge status: Home / Self Care | Payer: Medicaid Other | Attending: Emergency Medicine | Admitting: Emergency Medicine

## 2021-06-20 DIAGNOSIS — K029 Dental caries, unspecified: Secondary | ICD-10-CM | POA: Diagnosis not present

## 2021-06-20 DIAGNOSIS — K047 Periapical abscess without sinus: Secondary | ICD-10-CM

## 2021-06-20 MED ORDER — CLINDAMYCIN HCL 300 MG PO CAPS: 300.0000 mg | ORAL_CAPSULE | Freq: Three times a day (TID) | ORAL | 0 refills | Status: AC

## 2021-06-20 MED ORDER — LIDOCAINE VISCOUS HCL 2 % MT SOLN: 15.0000 mL | OROMUCOSAL | 0 refills | Status: DC | PRN

## 2021-06-20 NOTE — ED Provider Notes (Signed)
[facility]    CSN: [id number] Arrival date & time: 06/20/21  1036      History   Chief Complaint Chief Complaint  Patient presents with   Dental Pain    HPI Garrett Carrillo is a 30 y.o. male.   Patient here for evaluation of left upper and lower dental pain and swelling that has been ongoing for the past several days.  Reports using orajel with minimal relief.  Patient has not been evaluated by a dentist in awhile.  Denies any trauma, injury, or other precipitating event.  Denies any specific alleviating or aggravating factors.  Denies any fevers, chest pain, shortness of breath, N/V/D, numbness, tingling, weakness, abdominal pain, or headaches.     The history is provided by the patient.  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 Authorizing Provider  clindamycin (CLEOCIN) 300 MG capsule Take 1 capsule (300 mg total) by mouth 3 (three) times daily for 10 days. 06/20/21 06/30/21 Yes Ivette Loyal, NP  lidocaine (XYLOCAINE) 2 % solution Use as directed 15 mLs in  the mouth or throat as needed for mouth pain. 06/20/21  Yes Ivette Loyal, NP  benzonatate (TESSALON) 100 MG capsule Take 1-2 capsules (100-200 mg total) by mouth 3 (three) times daily as needed. 12/06/20   Wallis Bamberg, PA-C  cetirizine (ZYRTEC ALLERGY) 10 MG tablet Take 1 tablet (10 mg total) by mouth daily. 12/06/20   Wallis Bamberg, PA-C  cyclobenzaprine (FLEXERIL) 10 MG tablet Take 1 tablet (10 mg total) by mouth 2 (two) times daily as needed for muscle spasms. 08/24/20   Sabas Sous, MD  hydrOXYzine (VISTARIL) 25 MG capsule Take 1 capsule (25 mg total) by mouth every 8 (eight) hours as needed for anxiety. 11/05/19   Charlsie Quest, MD  predniSONE (DELTASONE) 20 MG tablet Take 2 tablets (40 mg total) by mouth daily with breakfast. 08/24/20   Particia Nearing, PA-C  promethazine-dextromethorphan (PROMETHAZINE-DM) 6.25-15 MG/5ML syrup Take 5 mLs by mouth at bedtime as needed for cough. 12/06/20   Wallis Bamberg, PA-C  pseudoephedrine (SUDAFED) 60 MG tablet Take 1 tablet (60 mg total) by mouth every 8 (eight) hours as needed for congestion. 12/06/20   Wallis Bamberg, PA-C    Family History Family History  Adopted: Yes  Problem Relation Age of Onset   Hypertension Mother    Hypertension Maternal Grandmother    Hypertension Maternal Grandfather    Hypertension Paternal Grandmother    Hypertension Paternal Grandfather  Seizures Neg Hx     Social History Social History   Tobacco Use   Smoking status: Every Day    Packs/day: 1.00    Years: 10.00    Pack years: 10.00    Types: Cigarettes   Smokeless tobacco: Never  Vaping Use   Vaping Use: Never used  Substance Use Topics   Alcohol use: Yes    Comment: occasional   Drug use: Not Currently    Types: Marijuana, Benzodiazepines    Comment: Marijuana occasionally. no xanax since Dec. 2014; buys xanax off the street     Allergies   Penicillins and Sulfa antibiotics   Review of Systems Review of Systems  HENT:  Positive for  dental problem.   All other systems reviewed and are negative.   Physical Exam Triage Vital Signs ED Triage Vitals  Enc Vitals Group     BP 06/20/21 1147 107/74     Pulse Rate 06/20/21 1147 71     Resp 06/20/21 1147 20     Temp 06/20/21 1147 98 F (36.7 C)     Temp src --      SpO2 06/20/21 1147 97 %     Weight --      Height --      Head Circumference --      Peak Flow --      Pain Score 06/20/21 1144 7     Pain Loc --      Pain Edu? --      Excl. in GC? --    No data found.  Updated Vital Signs BP 107/74   Pulse 71   Temp 98 F (36.7 C)   Resp 20   SpO2 97%   Visual Acuity Right Eye Distance:   Left Eye Distance:   Bilateral Distance:    Right Eye Near:   Left Eye Near:    Bilateral Near:     Physical Exam Vitals and nursing note reviewed.  Constitutional:      General: He is not in acute distress.    Appearance: Normal appearance. He is not ill-appearing, toxic-appearing or diaphoretic.  HENT:     Head: Normocephalic and atraumatic.     Mouth/Throat:     Dentition: Abnormal dentition. Dental tenderness, dental caries and dental abscesses present.  Eyes:     Conjunctiva/sclera: Conjunctivae normal.  Cardiovascular:     Rate and Rhythm: Normal rate.     Pulses: Normal pulses.  Pulmonary:     Effort: Pulmonary effort is normal.  Abdominal:     General: Abdomen is flat.  Musculoskeletal:        General: Normal range of motion.     Cervical back: Normal range of motion.  Skin:    General: Skin is warm and dry.  Neurological:     General: No focal deficit present.     Mental Status: He is alert and oriented to person, place, and time.  Psychiatric:        Mood and Affect: Mood normal.     UC Treatments / Results  Labs (all labs ordered are listed, but only abnormal results are displayed) Labs Reviewed - No data to display  EKG   Radiology No results found.  Procedures Procedures (including critical care time)  Medications Ordered in  UC Medications - No data to display  Initial Impression / Assessment and Plan / UC Course  I have reviewed the triage vital signs and the nursing notes.  Pertinent labs &  imaging results that were available during my care of the patient were reviewed by me and considered in my medical decision making (see chart for details).    Assessment negative for red flags or concerns.   Clindamycin prescribed due to penicillin allergy Recommend Ibuprofen and/or Tylenol as needed.  Prescribed viscous lidocaine for pain relief.  Recommend soft diet until evaluated by dentist Maintain oral hygiene care Follow up with dentist as soon as possible for further evaluation and treatment.  Patient given dental resource sheet Return or go to the ED if you have any new or worsening symptoms such as fever, chills, difficulty swallowing, painful swallowing, oral or neck swelling, nausea, vomiting, chest pain, SOB.  Reviewed expectations re: course of current medical issues. Questions answered. Outlined signs and symptoms indicating need for more acute intervention. Patient verbalized understanding. After Visit Summary given.  Final Clinical Impressions(s) / UC Diagnoses   Final diagnoses:  Dental abscess  Infected dental caries     Discharge Instructions      Take the Clindamycin three times a day for the next 10 days.    You can take Ibuprofen and/or Tylenol as needed for pain and fevers.  You can use the viscous lidocaine for pain relief (you can soak a cotton ball in the lidocaine and then place it on your tooth).  Stick with a soft diet until evaluated by dentist and maintain oral hygiene care.   Follow up with dentist as soon as possible for further evaluation and treatment   Return or go to the ED if you have any new or worsening symptoms such as fever, chills, difficulty swallowing, painful swallowing, oral or neck swelling, nausea, vomiting, chest pain, SOB.     ED Prescriptions      Medication Sig Dispense Auth. Provider   clindamycin (CLEOCIN) 300 MG capsule Take 1 capsule (300 mg total) by mouth 3 (three) times daily for 10 days. 30 capsule Chales Salmon R, NP   lidocaine (XYLOCAINE) 2 % solution Use as directed 15 mLs in the mouth or throat as needed for mouth pain. 100 mL Ivette Loyal, NP      PDMP not reviewed this encounter.   Ivette Loyal, NP 06/20/21 1204

## 2021-06-20 NOTE — Discharge Instructions (Addendum)
Take the Clindamycin three times a day for the next 10 days.    You can take Ibuprofen and/or Tylenol as needed for pain and fevers.  You can use the viscous lidocaine for pain relief (you can soak a cotton ball in the lidocaine and then place it on your tooth).  Stick with a soft diet until evaluated by dentist and maintain oral hygiene care.   Follow up with dentist as soon as possible for further evaluation and treatment   Return or go to the ED if you have any new or worsening symptoms such as fever, chills, difficulty swallowing, painful swallowing, oral or neck swelling, nausea, vomiting, chest pain, SOB.

## 2021-06-20 NOTE — ED Triage Notes (Signed)
Pt reports Lt dental pain

## 2021-08-09 DIAGNOSIS — H1089 Other conjunctivitis: Secondary | ICD-10-CM | POA: Diagnosis not present

## 2021-08-09 DIAGNOSIS — L539 Erythematous condition, unspecified: Secondary | ICD-10-CM | POA: Diagnosis not present

## 2021-08-20 ENCOUNTER — Emergency Department (HOSPITAL_COMMUNITY)
Admission: EM | Admit: 2021-08-20 | Discharge: 2021-08-20 | Discharge status: Against Medical Advice | Payer: Medicaid Other
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
## 2021-08-29 ENCOUNTER — Emergency Department (HOSPITAL_BASED_OUTPATIENT_CLINIC_OR_DEPARTMENT_OTHER): Payer: Medicaid Other

## 2021-08-29 ENCOUNTER — Emergency Department (HOSPITAL_BASED_OUTPATIENT_CLINIC_OR_DEPARTMENT_OTHER)
Admission: EM | Admit: 2021-08-29 | Discharge: 2021-08-29 | Disposition: A | Discharge status: Home / Self Care | Payer: Medicaid Other | Attending: Emergency Medicine | Admitting: Emergency Medicine

## 2021-08-29 ENCOUNTER — Encounter (HOSPITAL_BASED_OUTPATIENT_CLINIC_OR_DEPARTMENT_OTHER): Payer: Self-pay

## 2021-08-29 ENCOUNTER — Other Ambulatory Visit: Payer: Self-pay

## 2021-08-29 DIAGNOSIS — N433 Hydrocele, unspecified: Secondary | ICD-10-CM | POA: Diagnosis not present

## 2021-08-29 DIAGNOSIS — I861 Scrotal varices: Secondary | ICD-10-CM | POA: Diagnosis not present

## 2021-08-29 DIAGNOSIS — N451 Epididymitis: Secondary | ICD-10-CM | POA: Insufficient documentation

## 2021-08-29 DIAGNOSIS — F17210 Nicotine dependence, cigarettes, uncomplicated: Secondary | ICD-10-CM | POA: Insufficient documentation

## 2021-08-29 DIAGNOSIS — N50811 Right testicular pain: Secondary | ICD-10-CM | POA: Diagnosis present

## 2021-08-29 MED ORDER — LEVOFLOXACIN 500 MG PO TABS: 500.0000 mg | ORAL_TABLET | Freq: Every day | ORAL | 0 refills | Status: DC

## 2021-08-29 MED ORDER — ACETAMINOPHEN 325 MG PO TABS
650.0000 mg | ORAL_TABLET | Freq: Once | ORAL | Status: AC
  Administered 2021-08-29: 650 mg via ORAL
  Filled 2021-08-29: qty 2

## 2021-08-29 MED ORDER — IBUPROFEN 400 MG PO TABS
600.0000 mg | ORAL_TABLET | Freq: Once | ORAL | Status: AC
  Administered 2021-08-29: 600 mg via ORAL
  Filled 2021-08-29: qty 1

## 2021-08-29 MED ORDER — LEVOFLOXACIN 500 MG PO TABS
500.0000 mg | ORAL_TABLET | Freq: Once | ORAL | Status: AC
  Administered 2021-08-29: 500 mg via ORAL
  Filled 2021-08-29: qty 1

## 2021-08-29 NOTE — ED Notes (Signed)
Patient transported to Ultrasound 

## 2021-08-29 NOTE — ED Provider Notes (Signed)
[facility] EMERGENCY DEPARTMENT Provider Note   CSN: [id number] Arrival date & time: 08/29/21  2012     History Chief Complaint  Patient presents with   Groin Swelling    Garrett Carrillo is a 30 y.o. male.  Patient presents with right testicle pain ongoing for 2 days now describes a sharp and aching otherwise nonradiating.  Denies any trauma or fall.  Denies any discharge.  Denies any prior history of sexually transmitted disease.      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   Cannabis use disorder, moderate, dependence (HCC)    Syncope and collapse    ADHD, impulsive type 11/23/2014   Contusion of right hand 03/18/2014   Nicotine use disorder 08/03/2009    Past Surgical History:  Procedure Laterality Date   eardrum repair Right    thumb surgery Right 2009       Family History  Adopted: Yes  Problem Relation Age of Onset   Hypertension Mother    Hypertension Maternal Grandmother    Hypertension Maternal Grandfather    Hypertension Paternal Grandmother    Hypertension Paternal Grandfather    Seizures Neg Hx     Social History   Tobacco Use   Smoking status: Every Day    Packs/day: 1.00    Years: 10.00    Pack years: 10.00    Types: Cigarettes   Smokeless tobacco: Never  Vaping Use   Vaping Use: Never used  Substance Use Topics   Alcohol use: Yes    Comment: occasional   Drug use: Not Currently    Types: Marijuana, Benzodiazepines     Home Medications Prior to Admission medications   Medication Sig Start Date End Date Taking? Authorizing Provider  benzonatate (TESSALON) 100 MG capsule Take 1-2 capsules (100-200 mg total) by mouth 3 (three) times daily as needed. 12/06/20   Wallis Bamberg, PA-C  cetirizine (ZYRTEC ALLERGY) 10 MG tablet Take 1 tablet (10 mg total) by mouth daily. 12/06/20   Wallis Bamberg, PA-C  cyclobenzaprine (FLEXERIL) 10 MG tablet Take 1 tablet (10 mg total) by mouth 2 (two) times daily as needed for muscle spasms. 08/24/20   Sabas Sous, MD  hydrOXYzine (VISTARIL) 25 MG capsule Take 1 capsule (25 mg total) by mouth every 8 (eight) hours as needed for anxiety. 11/05/19   Charlsie Quest, MD  lidocaine (XYLOCAINE) 2 % solution Use as directed 15 mLs in the mouth or throat as needed for mouth pain. 06/20/21   Ivette Loyal, NP  predniSONE (DELTASONE) 20 MG tablet Take 2 tablets (40 mg total) by mouth daily with breakfast. 08/24/20   Particia Nearing, PA-C  promethazine-dextromethorphan (PROMETHAZINE-DM) 6.25-15 MG/5ML syrup Take 5 mLs by mouth at bedtime as needed for cough. 12/06/20   Wallis Bamberg, PA-C  pseudoephedrine (SUDAFED) 60 MG tablet Take 1 tablet (60 mg total) by mouth every 8 (eight) hours as needed for congestion. 12/06/20   Wallis Bamberg, PA-C  Allergies    Penicillins and Sulfa antibiotics  Review of Systems   Review of Systems  Constitutional:  Negative for fever.  HENT:  Negative for ear pain and sore throat.   Eyes:  Negative for pain.  Respiratory:  Negative for cough.   Cardiovascular:  Negative for chest pain.  Gastrointestinal:  Negative for abdominal pain.  Genitourinary:  Negative for flank pain.  Musculoskeletal:  Negative for back pain.  Skin:  Negative for color change and rash.  Neurological:  Negative for syncope.  All other systems reviewed and are negative.  Physical Exam Updated Vital Signs BP 134/74 (BP Location: Left Arm)   Pulse 97   Temp 98.6 F (37 C)  (Oral)   Resp 18   Ht 6\' 1"  (1.854 m)   Wt 114.8 kg   SpO2 100%   BMI 33.38 kg/m   Physical Exam Constitutional:      Appearance: He is well-developed.  HENT:     Head: Normocephalic.     Nose: Nose normal.  Eyes:     Extraocular Movements: Extraocular movements intact.  Cardiovascular:     Rate and Rhythm: Normal rate.  Pulmonary:     Effort: Pulmonary effort is normal.  Genitourinary:    Comments: No penile discharge or other lesion noted.  Right testicle is tender to palpation.  Cremasteric reflex otherwise intact. Skin:    Coloration: Skin is not jaundiced.  Neurological:     Mental Status: He is alert. Mental status is at baseline.    ED Results / Procedures / Treatments   Labs (all labs ordered are listed, but only abnormal results are displayed) Labs Reviewed - No data to display  EKG None  Radiology Scrotum  Result Date: 08/29/2021 CLINICAL DATA:  Right-sided testicular pain and swelling. EXAM: SCROTAL ULTRASOUND DOPPLER ULTRASOUND OF THE TESTICLES TECHNIQUE: Complete ultrasound examination of the testicles, epididymis, and other scrotal structures was performed. Color and spectral Doppler ultrasound were also utilized to evaluate blood flow to the testicles. COMPARISON:  None. FINDINGS: Right testicle Measurements: 4.6 cm x 2.4 cm x 3.4 cm. No mass or microlithiasis visualized. Left testicle Measurements: 4.6 cm x 2.4 cm x 2.8 cm. No mass or microlithiasis visualized. Right epididymis: The right epididymis is enlarged and heterogeneous in appearance. Increased flow is seen throughout the right epididymis on color Doppler evaluation. Left epididymis:  Normal in size and appearance. Hydrocele:  A small right-sided hydrocele is noted. Varicocele:  A right-sided varicocele is noted Pulsed Doppler interrogation of both testes demonstrates normal low resistance arterial and venous waveforms bilaterally. IMPRESSION: 1. Findings consistent with RIGHT-sided epididymitis. 2.  RIGHT-sided hydrocele and RIGHT-sided varicocele. Electronically Signed   By: 10/29/2021 M.D.   On: 08/29/2021 21:22    Procedures Procedures   Medications Ordered in ED Medications  levofloxacin (LEVAQUIN) tablet 500 mg (has no administration in time range)  acetaminophen (TYLENOL) tablet 650 mg (650 mg Oral Given 08/29/21 2234)  ibuprofen (ADVIL) tablet 600 mg (600 mg Oral Given 08/29/21 2234)    ED Course  I have reviewed the triage vital signs and the nursing notes.  Pertinent labs & imaging results that were available during my care of the patient were reviewed by me and considered in my medical decision making (see chart for details).    MDM Rules/Calculators/A&P                           No evidence of  torsion on ultrasound.  Findings consistent with right-sided epididymitis.  Patient given Levaquin here in the ER and a prescription to go home with.  Advise outpatient follow-up with urology within the week.  Advising immediate return for worsening pain or any additional concerns.  Final Clinical Impression(s) / ED Diagnoses Final diagnoses:  Epididymitis    Rx / DC Orders ED Discharge Orders     None        Cheryll Cockayne, MD 08/29/21 2237

## 2021-08-29 NOTE — ED Triage Notes (Signed)
Pt c/o right testicle swelling/pain started yesterday-denies injury-NAD-steady gait

## 2021-08-29 NOTE — Discharge Instructions (Addendum)
Take the antibiotics as written.  Take Tylenol 500 to 650 mg as needed 3 times a day or Motrin 600 mg as needed 3 times a day for pain.  Call your primary care doctor or specialist as discussed in the next 2-3 days.   Return immediately back to the ER if:  Your symptoms worsen within the next 12-24 hours. You develop new symptoms such as new fevers, persistent vomiting, new pain, shortness of breath, or new weakness or numbness, or if you have any other concerns.

## 2021-09-30 DIAGNOSIS — Z136 Encounter for screening for cardiovascular disorders: Secondary | ICD-10-CM | POA: Diagnosis not present

## 2021-09-30 DIAGNOSIS — G40909 Epilepsy, unspecified, not intractable, without status epilepticus: Secondary | ICD-10-CM | POA: Diagnosis not present

## 2021-10-24 DIAGNOSIS — R899 Unspecified abnormal finding in specimens from other organs, systems and tissues: Secondary | ICD-10-CM | POA: Diagnosis not present

## 2021-10-24 DIAGNOSIS — R7303 Prediabetes: Secondary | ICD-10-CM | POA: Diagnosis not present

## 2021-10-24 DIAGNOSIS — Z1159 Encounter for screening for other viral diseases: Secondary | ICD-10-CM | POA: Diagnosis not present

## 2021-10-24 DIAGNOSIS — B353 Tinea pedis: Secondary | ICD-10-CM | POA: Diagnosis not present

## 2021-10-24 DIAGNOSIS — F419 Anxiety disorder, unspecified: Secondary | ICD-10-CM | POA: Diagnosis not present

## 2021-10-24 DIAGNOSIS — Z720 Tobacco use: Secondary | ICD-10-CM | POA: Diagnosis not present

## 2022-03-07 DIAGNOSIS — B353 Tinea pedis: Secondary | ICD-10-CM | POA: Diagnosis not present

## 2022-03-07 DIAGNOSIS — F514 Sleep terrors [night terrors]: Secondary | ICD-10-CM | POA: Diagnosis not present

## 2022-03-07 DIAGNOSIS — F209 Schizophrenia, unspecified: Secondary | ICD-10-CM | POA: Diagnosis not present

## 2022-03-07 DIAGNOSIS — F411 Generalized anxiety disorder: Secondary | ICD-10-CM | POA: Diagnosis not present

## 2022-03-07 DIAGNOSIS — R7301 Impaired fasting glucose: Secondary | ICD-10-CM | POA: Diagnosis not present

## 2022-03-07 DIAGNOSIS — L84 Corns and callosities: Secondary | ICD-10-CM | POA: Diagnosis not present

## 2022-03-07 DIAGNOSIS — F419 Anxiety disorder, unspecified: Secondary | ICD-10-CM | POA: Diagnosis not present

## 2022-03-07 DIAGNOSIS — F3181 Bipolar II disorder: Secondary | ICD-10-CM | POA: Diagnosis not present

## 2022-04-25 DIAGNOSIS — G40909 Epilepsy, unspecified, not intractable, without status epilepticus: Secondary | ICD-10-CM | POA: Diagnosis not present

## 2022-04-25 DIAGNOSIS — R454 Irritability and anger: Secondary | ICD-10-CM | POA: Diagnosis not present

## 2022-04-25 DIAGNOSIS — K219 Gastro-esophageal reflux disease without esophagitis: Secondary | ICD-10-CM | POA: Diagnosis not present

## 2022-04-25 DIAGNOSIS — F3181 Bipolar II disorder: Secondary | ICD-10-CM | POA: Diagnosis not present

## 2022-04-25 DIAGNOSIS — G43909 Migraine, unspecified, not intractable, without status migrainosus: Secondary | ICD-10-CM | POA: Diagnosis not present

## 2022-06-27 ENCOUNTER — Ambulatory Visit (HOSPITAL_COMMUNITY)
Admission: EM | Admit: 2022-06-27 | Discharge: 2022-06-27 | Disposition: A | Discharge status: Home / Self Care | Payer: Medicaid Other | Attending: Physician Assistant | Admitting: Physician Assistant

## 2022-06-27 ENCOUNTER — Encounter (HOSPITAL_COMMUNITY): Payer: Self-pay | Admitting: *Deleted

## 2022-06-27 ENCOUNTER — Ambulatory Visit (INDEPENDENT_AMBULATORY_CARE_PROVIDER_SITE_OTHER): Payer: Medicaid Other

## 2022-06-27 DIAGNOSIS — W109XXA Fall (on) (from) unspecified stairs and steps, initial encounter: Secondary | ICD-10-CM | POA: Diagnosis not present

## 2022-06-27 DIAGNOSIS — M25562 Pain in left knee: Secondary | ICD-10-CM | POA: Diagnosis not present

## 2022-06-27 DIAGNOSIS — M79605 Pain in left leg: Secondary | ICD-10-CM | POA: Diagnosis not present

## 2022-06-27 DIAGNOSIS — W19XXXA Unspecified fall, initial encounter: Secondary | ICD-10-CM

## 2022-06-27 MED ORDER — IBUPROFEN 800 MG PO TABS: 800.0000 mg | ORAL_TABLET | Freq: Three times a day (TID) | ORAL | 0 refills | Status: DC | PRN

## 2022-06-27 NOTE — ED Triage Notes (Signed)
Patient fell down steps about an hour ago at movies, left leg pain can hardly move. Took 1000mg  Advil no relief. Foot going numb can not feel left foot.   Patient doesn't know meds waiting on list from someone at home

## 2022-06-27 NOTE — Discharge Instructions (Signed)
Your x-rays were normal indicating that there is nothing broken.  Given the severity of your pain and the numbness I do think you should follow-up with orthopedic provider.  I recommend that you go to [facility] since they have a walk-in clinic available this evening.  You can call to schedule an appointment.  If you have any worsening symptoms including persistent or severe numbness, difficulty walking, increased pain you need to be seen immediately.

## 2022-06-27 NOTE — ED Provider Notes (Signed)
[place]    CSN: [id number] Arrival date & time: 06/27/22  1552      History   Chief Complaint Chief Complaint  Patient presents with   Fall    HPI Garrett Carrillo is a 31 y.o. male.   Patient presents today with a several hour history of left leg pain.  Reports that he was at the movies when he tripped and fell with the majority of his weight landing on his lateral left knee and leg.  Since that time he had ongoing pain.  Reports pain is minimal at rest but increases to 10 with attempted ambulation, localized to lateral left knee with radiation throughout lateral leg, described as shooting, no aggravating or alleviating factors identified.  He has tried ibuprofen without improvement of symptoms.  Denies any previous injury or surgery involving knee.  He does report numbness on the lateral portion of his left foot.  He has been able to ambulate unassisted.  He is confident that he did not hit his head and denies any headache, dizziness, loss of consciousness, nausea, vomiting, amnesia surrounding event.    Past Medical History:  Diagnosis Date   ADHD (attention deficit hyperactivity disorder)    Cannabis abuse    Depression    Suicide attempt [facility])     Patient Active Problem List   Diagnosis Date Noted   Panic attack as reaction to stress 11/05/2019   Seizure-like activity - monitored inpatient eeg  was normal with a panic attack witnesed 11/03/2019   Cannabis abuse    MDD (major depressive disorder), recurrent severe, without psychosis ([place]) 03/22/2015   Sedative, hypnotic or anxiolytic use disorder, severe, dependence ([place]) 03/22/2015   Cannabis use disorder, moderate, dependence ([place])    Syncope and collapse    ADHD, impulsive type 11/23/2014   Contusion of right hand 03/18/2014   Nicotine use disorder 08/03/2009    Past Surgical History:  Procedure Laterality Date   eardrum repair Right    thumb surgery Right 2009       Home Medications     Prior to Admission medications   Medication Sig Start Date End Date Taking? Authorizing Provider  ciclopirox (LOPROX) 0.77 % cream SMARTSIG:Topical Morning-Evening 03/07/22  Yes [provider]  fluconazole (DIFLUCAN) 150 MG tablet Take 150 mg by mouth 2 (two) times a week. 04/11/22  Yes [provider]  hydrOXYzine (ATARAX) 25 MG tablet Take 25 mg by mouth 2 (two) times daily. 04/11/22  Yes [provider]  omeprazole (PRILOSEC) 20 MG capsule Take 20 mg by mouth daily. 04/25/22  Yes [provider]  prazosin (MINIPRESS) 1 MG capsule Take 1 mg by mouth at bedtime. 04/11/22  Yes [provider]  risperiDONE (RISPERDAL) 0.25 MG tablet Take 0.25 mg by mouth 2 (two) times daily as needed. 05/10/22  Yes [provider]  UBRELVY 100 MG TABS Take 1 tablet by mouth as needed. 05/08/22  Yes [provider]  VRAYLAR 1.5 MG capsule Take 1.5 mg by mouth daily. 03/07/22  Yes [provider]  VRAYLAR 3 MG capsule Take 3 mg by mouth daily. 04/25/22  Yes [provider]  ibuprofen (ADVIL) 800 MG tablet Take 1 tablet (800 mg total) by mouth every 8 (eight) hours as needed. 06/27/22   Kypton Eltringham, Derry Skill, PA-C    Family History Family History  Adopted: Yes  Problem Relation Age of Onset   Hypertension Mother    Hypertension Maternal Grandmother    Hypertension Maternal  Grandfather    Hypertension Paternal Grandmother    Hypertension Paternal Grandfather    Seizures Neg Hx     Social History Social History   Tobacco Use   Smoking status: Every Day    Packs/day: 1.00    Years: 10.00    Total pack years: 10.00    Types: Cigarettes   Smokeless tobacco: Never  Vaping Use   Vaping Use: Never used  Substance Use Topics   Alcohol use: Yes    Comment: occasional   Drug use: Not Currently    Types: Marijuana, Benzodiazepines     Allergies   Penicillins and Sulfa antibiotics   Review of Systems Review of Systems   Constitutional:  Positive for activity change. Negative for appetite change, fatigue and fever.  Musculoskeletal:  Positive for arthralgias and gait problem. Negative for joint swelling and myalgias.  Skin:  Negative for wound.  Neurological:  Positive for numbness. Negative for dizziness, weakness, light-headedness and headaches.     Physical Exam Triage Vital Signs ED Triage Vitals  Enc Vitals Group     BP 06/27/22 1605 136/84     Pulse Rate 06/27/22 1605 79     Resp 06/27/22 1605 18     Temp 06/27/22 1605 98.9 F (37.2 C)     Temp Source 06/27/22 1605 Oral     SpO2 06/27/22 1605 97 %     Weight --      Height --      Head Circumference --      Peak Flow --      Pain Score 06/27/22 1603 9     Pain Loc --      Pain Edu? --      Excl. in [place]? --    No data found.  Updated Vital Signs BP 136/84 (BP Location: Left Arm)   Pulse 79   Temp 98.9 F (37.2 C) (Oral)   Resp 18   SpO2 97%   Visual Acuity Right Eye Distance:   Left Eye Distance:   Bilateral Distance:    Right Eye Near:   Left Eye Near:    Bilateral Near:     Physical Exam Vitals reviewed.  Constitutional:      General: He is awake.     Appearance: Normal appearance. He is well-developed. He is not ill-appearing.     Comments: Very pleasant male appears stated age in no acute distress sitting comfortably in exam room  HENT:     Head: Normocephalic and atraumatic.     Mouth/Throat:     Pharynx: No oropharyngeal exudate, posterior oropharyngeal erythema or uvula swelling.  Cardiovascular:     Rate and Rhythm: Normal rate and regular rhythm.     Pulses:          Posterior tibial pulses are 2+ on the right side and 2+ on the left side.     Heart sounds: Normal heart sounds, S1 normal and S2 normal. No murmur heard.    Comments: Capillary fill within 2 seconds left foot Pulmonary:     Effort: Pulmonary effort is normal.     Breath sounds: Normal breath sounds. No stridor. No wheezing, rhonchi or rales.      Comments: Clear to auscultation bilaterally Musculoskeletal:     Left knee: No swelling or deformity. Decreased range of motion. Tenderness present over the lateral joint line. No MCL, LCL, ACL or PCL tenderness. No LCL laxity, MCL laxity, ACL laxity or PCL laxity.    Left  lower leg: Tenderness present. No bony tenderness.       Feet:     Comments: Left leg: Tenderness palpation at lateral left knee.  No deformity noted.  Strength 5/5.  No ligamentous laxity on exam.  Feet:     Left foot:     Protective Sensation: 10 sites tested.  4 sites sensed.  Neurological:     Mental Status: He is alert.  Psychiatric:        Behavior: Behavior is cooperative.      UC Treatments / Results  Labs (all labs ordered are listed, but only abnormal results are displayed) Labs Reviewed - No data to display  EKG   Radiology DG Tibia/Fibula Left  Result Date: 06/27/2022 CLINICAL DATA:  [place].  Left leg pain. EXAM: LEFT TIBIA AND FIBULA - 2 VIEW COMPARISON:  None Available. FINDINGS: The knee and ankle joints are maintained. No acute fracture of the tibia or fibula is identified. IMPRESSION: No acute bony findings. Electronically Signed   By: Marijo Sanes M.D.   On: 06/27/2022 17:03   DG Knee Complete 4 Views Left  Result Date: 06/27/2022 CLINICAL DATA:  [place] down steps.  Left knee pain. EXAM: LEFT KNEE - COMPLETE 4+ VIEW COMPARISON:  None Available. FINDINGS: The joint spaces are maintained. No acute fractures identified. No knee joint effusion. IMPRESSION: No acute bony findings. Electronically Signed   By: Marijo Sanes M.D.   On: 06/27/2022 17:02    Procedures Procedures (including critical care time)  Medications Ordered in UC Medications - No data to display  Initial Impression / Assessment and Plan / UC Course  I have reviewed the triage vital signs and the nursing notes.  Pertinent labs & imaging results that were available during my care of the patient were reviewed by me and  considered in my medical decision making (see chart for details).     No indication for head or neck CT after fall based on [place] CT rules.  X-rays obtained of knee and tib-fib which showed no acute osseous abnormalities.  Discussed contusion as likely etiology of symptoms.  Given severity of pain and associated numbness recommended that he go immediately to orthopedic for further evaluation and management since we do not have advanced imaging or additional capabilities in urgent care.  He was given contact information for [facility] with instruction to go to their walk-in clinic to which he expressed understanding.  He was given knee brace for support and comfort.  Was started on ibuprofen for pain relief with instruction not to take additional NSAIDs with this medication due to risk of GI bleeding.  If he has any worsening symptoms including severe pain, swelling, worsening numbness, paresthesias he is to go to the emergency room immediately.  Strict return precautions given.  Work excuse note provided.  Final Clinical Impressions(s) / UC Diagnoses   Final diagnoses:  Acute pain of left knee  Left leg pain  Fall, initial encounter     Discharge Instructions      Your x-rays were normal indicating that there is nothing broken.  Given the severity of your pain and the numbness I do think you should follow-up with orthopedic provider.  I recommend that you go to [facility] since they have a walk-in clinic available this evening.  You can call to schedule an appointment.  If you have any worsening symptoms including persistent or severe numbness, difficulty walking, increased pain you need to be seen immediately.     ED Prescriptions  Medication Sig Dispense Auth. Provider   ibuprofen (ADVIL) 800 MG tablet Take 1 tablet (800 mg total) by mouth every 8 (eight) hours as needed. 30 tablet Canon Gola, Noberto Retort, PA-C      PDMP not reviewed this encounter.   Jeani Hawking, PA-C 06/27/22  1713

## 2022-12-31 IMAGING — US US SCROTUM
1 series · 14 of 25 positions shown · non-contrast
Comparison: None.

CLINICAL DATA: Right-sided testicular pain and swelling.

EXAM:
SCROTAL ULTRASOUND
DOPPLER ULTRASOUND OF THE TESTICLES
TECHNIQUE: Complete ultrasound examination of the testicles, epididymis, and
other scrotal structures was performed. Color and spectral Doppler
ultrasound were also utilized to evaluate blood flow to the
testicles.

[Series 1: us scrotum · 62 acquisitions, 14 frames shown]
[im 1/62]
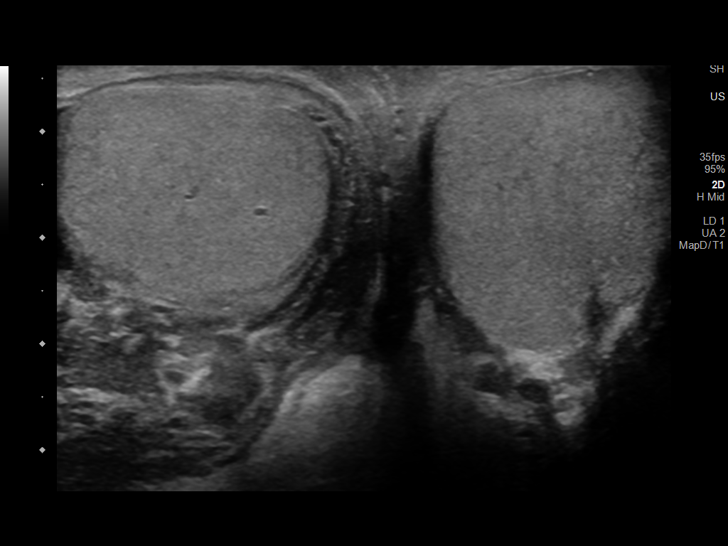
[im 6/62]
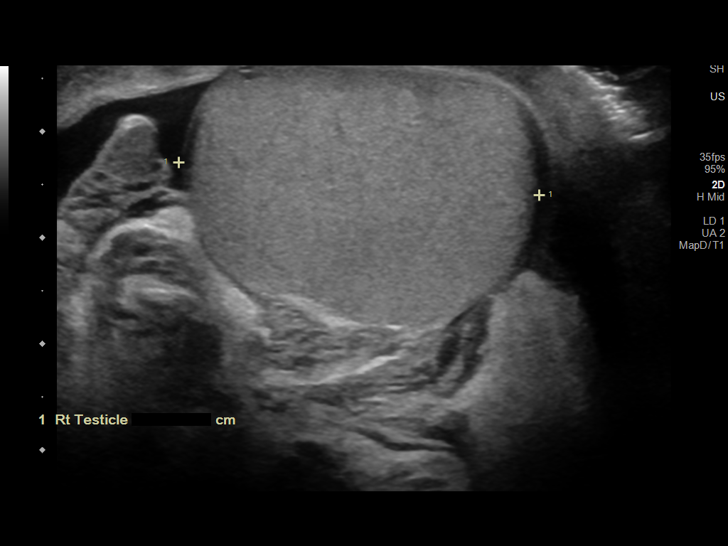
[im 11/62]
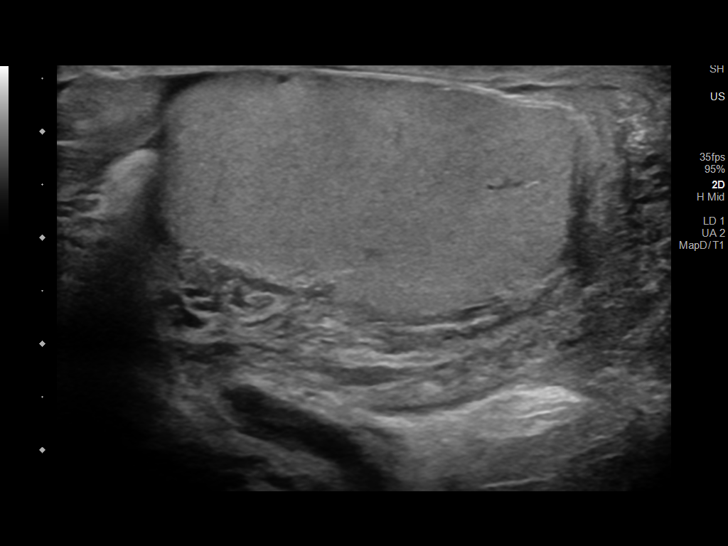
[im 16/62]
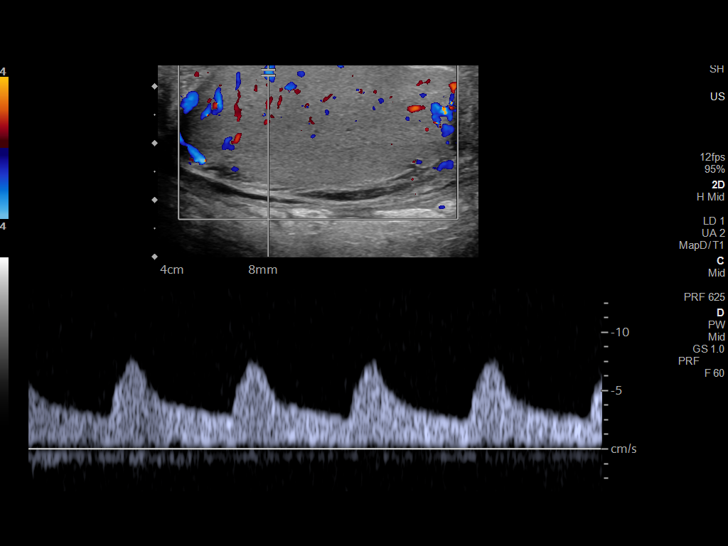
[im 21/62]
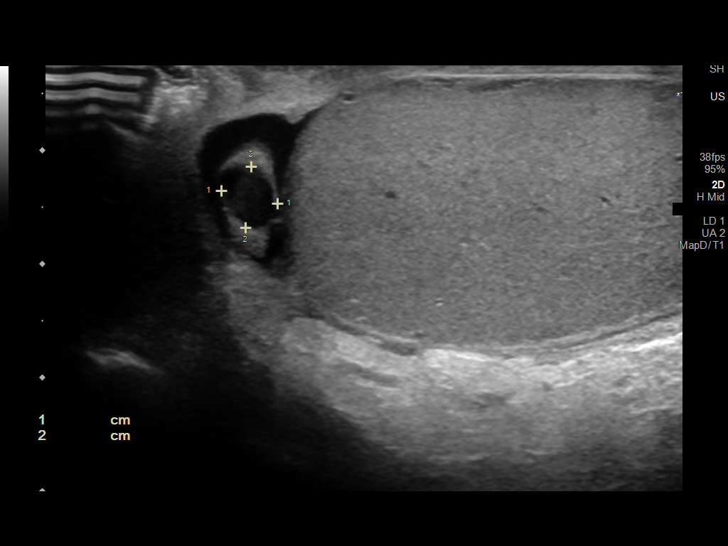
[im 23/62]
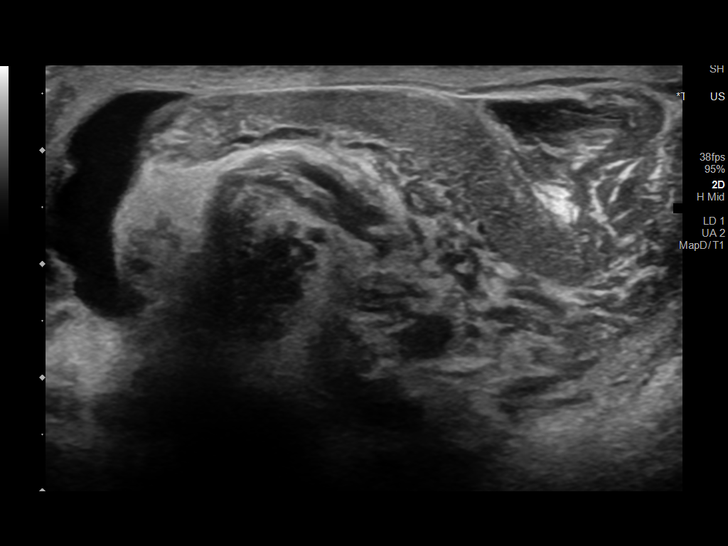
[im 28/62]
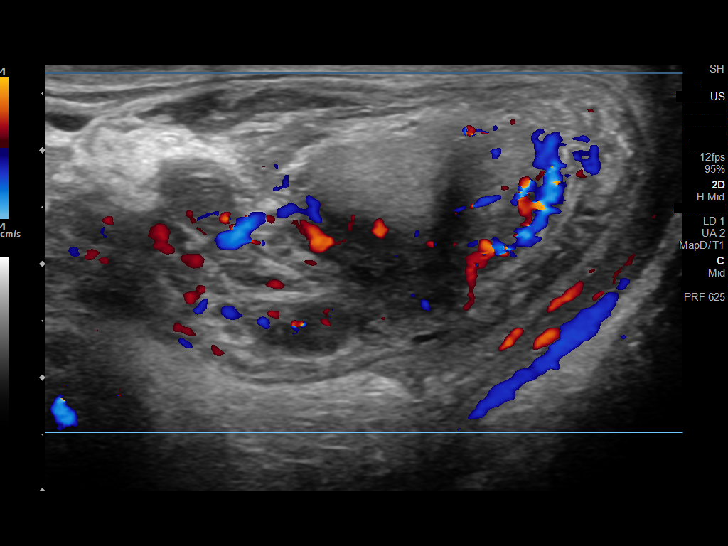
[im 34/62]
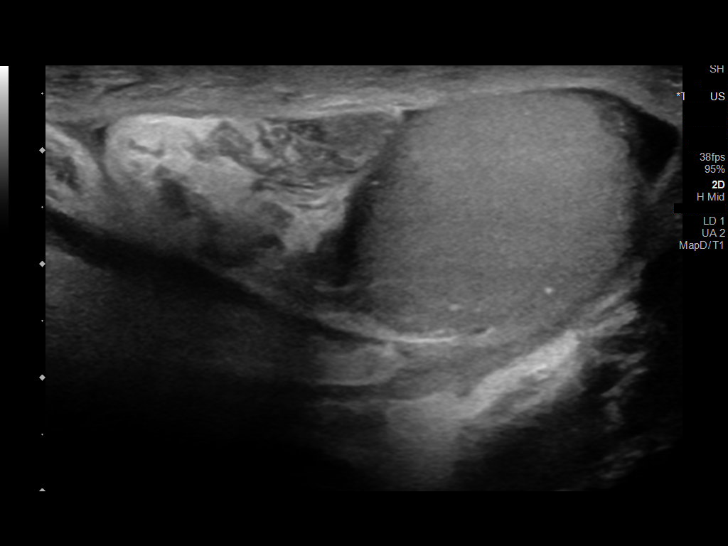
[im 39/62]
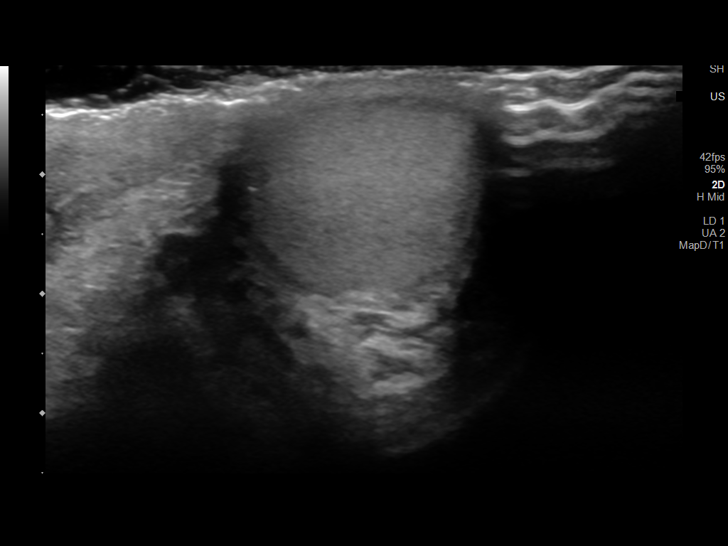
[im 41/62]
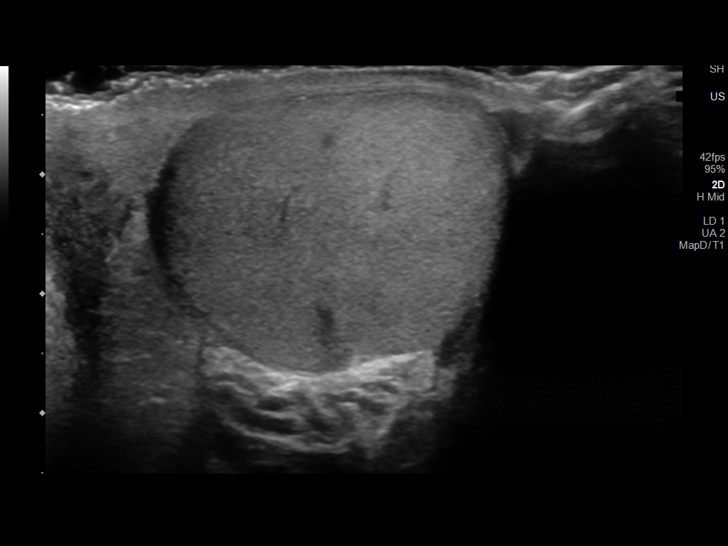
[im 46/62]
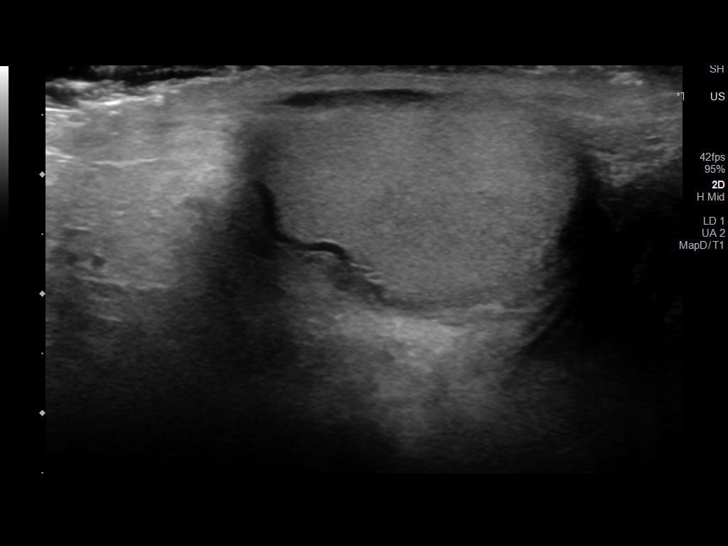
[im 51/62]
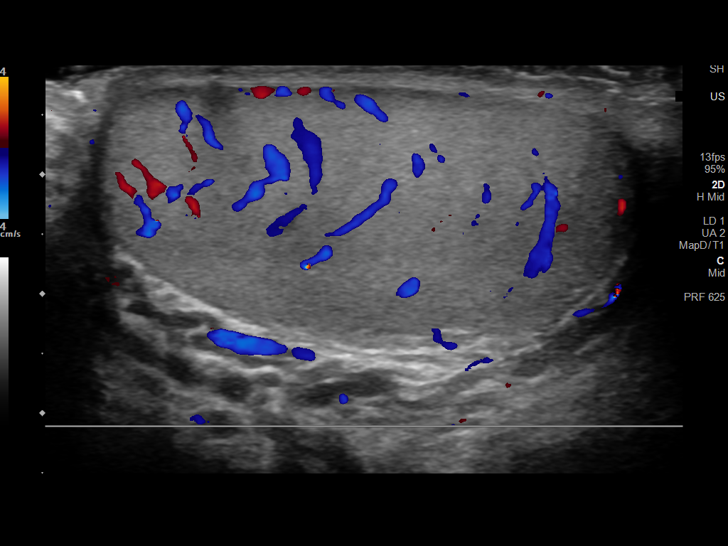
[im 56/62]
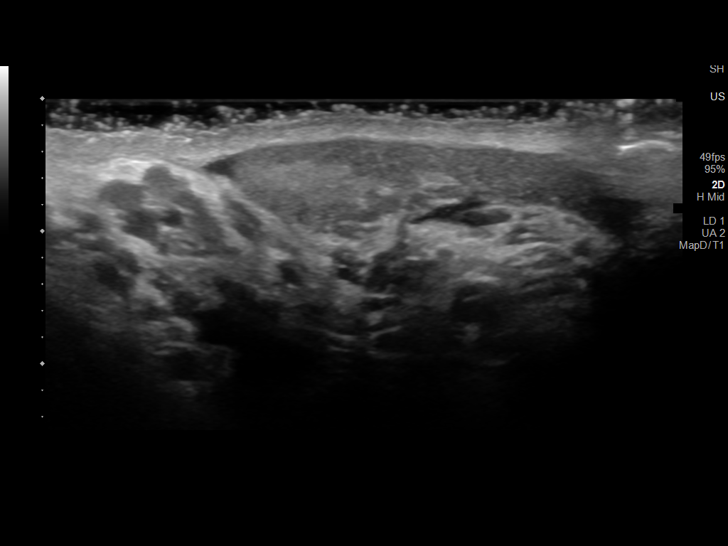
[im 62/62]
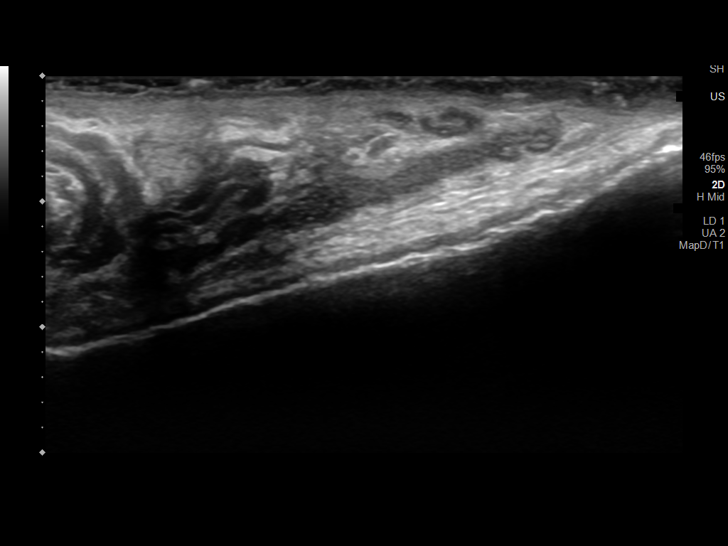

[14 of 25 positions shown; findings below may reference images not displayed]

FINDINGS: Right testicle

Measurements: 4.6 cm x 2.4 cm x 3.4 cm. No mass or microlithiasis
visualized.

Left testicle

Measurements: 4.6 cm x 2.4 cm x 2.8 cm. No mass or microlithiasis
visualized.

Right epididymis: The right epididymis is enlarged and heterogeneous
in appearance. Increased flow is seen throughout the right
epididymis on color Doppler evaluation.

Left epididymis:  Normal in size and appearance.

Hydrocele:  A small right-sided hydrocele is noted.

Varicocele:  A right-sided varicocele is noted

Pulsed Doppler interrogation of both testes demonstrates normal low
resistance arterial and venous waveforms bilaterally.
IMPRESSION: 1. Findings consistent with RIGHT-sided epididymitis.
2. RIGHT-sided hydrocele and RIGHT-sided varicocele.

## 2023-02-12 ENCOUNTER — Encounter (HOSPITAL_COMMUNITY): Payer: Self-pay

## 2023-02-12 ENCOUNTER — Ambulatory Visit (HOSPITAL_COMMUNITY)
Admission: EM | Admit: 2023-02-12 | Discharge: 2023-02-12 | Disposition: A | Discharge status: Home / Self Care | Payer: Medicaid Other | Attending: Internal Medicine | Admitting: Internal Medicine

## 2023-02-12 DIAGNOSIS — L03012 Cellulitis of left finger: Secondary | ICD-10-CM | POA: Diagnosis not present

## 2023-02-12 MED ORDER — DOXYCYCLINE HYCLATE 100 MG PO CAPS: 100.0000 mg | ORAL_CAPSULE | Freq: Two times a day (BID) | ORAL | 0 refills | Status: AC

## 2023-02-12 NOTE — ED Triage Notes (Signed)
Pt is here for injury to middle finger on left hand with drainage and a odor x few days .pt took  Hydrocodone taken at 2am this morning  for pain

## 2023-02-12 NOTE — Discharge Instructions (Addendum)
Take Doxycycline twice daily for the next 7 days to treat infection to your finger.  Soak your finger in Epsom salt to reduce swelling and pain.  Take ibuprofen/Tylenol as needed for pain.  If you develop any new or worsening symptoms or do not improve in the next 2 to 3 days, please return.  If your symptoms are severe, please go to the emergency room.  Follow-up with your primary care provider for further evaluation and management of your symptoms as well as ongoing wellness visits.  I hope you feel better!

## 2023-02-12 NOTE — ED Provider Notes (Signed)
[place]    CSN: [id number] Arrival date & time: 02/12/23  1046      History   Chief Complaint Chief Complaint  Patient presents with   Finger Injury    HPI Garrett Carrillo is a 32 y.o. male.   Patient presents to urgent care for evaluation of swelling and pain to the distal medial aspect of the left middle finger that started 3 days ago on Friday, February 09, 2023.  He states over the weekend, he was able to squeeze the site and pus came out of the area of greatest tenderness.  Reports significant pain to the medial nailbed.  Denies recent trauma/injury to the left hand and numbness/tingling distal to injury. Reports redness and swelling to the affected digit that has improved since onset. No recent antibiotic or steroid use. He is not a diabetic.  Took hydrocodone for pain this morning with relief.     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 Authorizing Provider  doxycycline (VIBRAMYCIN) 100 MG capsule Take 1 capsule (100 mg total) by mouth 2 (two) times daily for 7 days.  02/12/23 02/19/23 Yes Talbot Grumbling, FNP  ciclopirox (LOPROX) 0.77 % cream SMARTSIG:Topical Morning-Evening 03/07/22   [provider]  fluconazole (DIFLUCAN) 150 MG tablet Take 150 mg by mouth 2 (two) times a week. 04/11/22   [provider]  hydrOXYzine (ATARAX) 25 MG tablet Take 25 mg by mouth 2 (two) times daily. 04/11/22   [provider]  ibuprofen (ADVIL) 800 MG tablet Take 1 tablet (800 mg total) by mouth every 8 (eight) hours as needed. 06/27/22   Raspet, Derry Skill, PA-C  omeprazole (PRILOSEC) 20 MG capsule Take 20 mg by mouth daily. 04/25/22   [provider]  prazosin (MINIPRESS) 1 MG capsule Take 1 mg by mouth at bedtime. 04/11/22   [provider]  risperiDONE (RISPERDAL) 0.25 MG tablet Take 0.25 mg by mouth 2 (two) times daily as needed. 05/10/22   [provider]  UBRELVY 100 MG TABS Take 1 tablet by mouth as needed. 05/08/22   [provider]  VRAYLAR 1.5 MG capsule Take 1.5 mg by mouth daily. 03/07/22   [provider]  VRAYLAR 3 MG capsule Take 3 mg by mouth daily. 04/25/22   [provider]    Family History Family History  Adopted: Yes  Problem Relation Age of Onset   Hypertension Mother    Hypertension Maternal Grandmother    Hypertension Maternal Grandfather    Hypertension  Paternal Grandmother    Hypertension Paternal Grandfather    Seizures Neg Hx     Social History Social History   Tobacco Use   Smoking status: Every Day    Packs/day: 1.00    Years: 10.00    Additional pack years: 0.00    Total pack years: 10.00    Types: Cigarettes   Smokeless tobacco: Never  Vaping Use   Vaping Use: Never used  Substance Use Topics   Alcohol use: Yes    Comment: occasional   Drug use: Not Currently    Types: Marijuana, Benzodiazepines     Allergies   Penicillins and Sulfa antibiotics   Review of Systems Review of Systems Per HPI  Physical Exam Triage Vital Signs ED Triage Vitals   Enc Vitals Group     BP 02/12/23 1257 124/86     Pulse Rate 02/12/23 1257 64     Resp 02/12/23 1257 12     Temp 02/12/23 1257 98 F (36.7 C)     Temp Source 02/12/23 1257 Oral     SpO2 02/12/23 1257 98 %     Weight --      Height --      Head Circumference --      Peak Flow --      Pain Score 02/12/23 1251 5     Pain Loc --      Pain Edu? --      Excl. in [place]? --    No data found.  Updated Vital Signs BP 124/86 (BP Location: Right Arm)   Pulse 64   Temp 98 F (36.7 C) (Oral)   Resp 12   SpO2 98%   Visual Acuity Right Eye Distance:   Left Eye Distance:   Bilateral Distance:    Right Eye Near:   Left Eye Near:    Bilateral Near:     Physical Exam Vitals and nursing note reviewed.  Constitutional:      Appearance: He is not ill-appearing or toxic-appearing.  HENT:     Head: Normocephalic and atraumatic.     Right Ear: Hearing and external ear normal.     Left Ear: Hearing and external ear normal.     Nose: Nose normal.     Mouth/Throat:     Lips: Pink.  Eyes:     General: Lids are normal. Vision grossly intact. Gaze aligned appropriately.     Extraocular Movements: Extraocular movements intact.     Conjunctiva/sclera: Conjunctivae normal.  Pulmonary:     Effort: Pulmonary effort is normal.  Musculoskeletal:       Hands:     Cervical back: Neck supple.     Comments: Full ROM to the left hand/left middle finger. Strength and sensation intact to affected digit. Bilateral radial pulses +2.   Skin:    General: Skin is warm and dry.     Capillary Refill: Capillary refill takes less than 2 seconds.     Findings: No rash.  Neurological:     General: No focal deficit present.     Mental Status: He is alert and oriented to person, place, and time. Mental status is at baseline.     Cranial Nerves: No dysarthria or facial asymmetry.  Psychiatric:        Mood and Affect: Mood normal.        Speech: Speech normal.        Behavior: Behavior normal.        Thought  Content: Thought  content normal.        Judgment: Judgment normal.      UC Treatments / Results  Labs (all labs ordered are listed, but only abnormal results are displayed) Labs Reviewed - No data to display  EKG   Radiology No results found.  Procedures Procedures (including critical care time)  Medications Ordered in UC Medications - No data to display  Initial Impression / Assessment and Plan / UC Course  I have reviewed the triage vital signs and the nursing notes.  Pertinent labs & imaging results that were available during my care of the patient were reviewed by me and considered in my medical decision making (see chart for details).   1. Cellulitis of finger of left hand Presentation consistent with paronychia to the left middle finger. No drainage observed currently. Slightly red and swollen. Patient is allergic to penicillins with anaphylaxis, therefore will defer cephalosporin prescription. Doxycycline sent to pharmacy to be taken as prescribed for the next 7 days. Ibuprofen/tylenol as needed for pain. Epsom salt soaks. Neurovascularly intact distally, no indication for imaging based on atraumatic mechanism of injury. He is agreeable with plan.   Discussed physical exam and available lab work findings in clinic with patient.  Counseled patient regarding appropriate use of medications and potential side effects for all medications recommended or prescribed today. Discussed red flag signs and symptoms of worsening condition,when to call the PCP office, return to urgent care, and when to seek higher level of care in the emergency department. Patient verbalizes understanding and agreement with plan. All questions answered. Patient discharged in stable condition.    Final Clinical Impressions(s) / UC Diagnoses   Final diagnoses:  Cellulitis of finger of left hand     Discharge Instructions      Take Doxycycline twice daily for the next 7 days to treat infection to  your finger.  Soak your finger in Epsom salt to reduce swelling and pain.  Take ibuprofen/Tylenol as needed for pain.  If you develop any new or worsening symptoms or do not improve in the next 2 to 3 days, please return.  If your symptoms are severe, please go to the emergency room.  Follow-up with your primary care provider for further evaluation and management of your symptoms as well as ongoing wellness visits.  I hope you feel better!     ED Prescriptions     Medication Sig Dispense Auth. Provider   doxycycline (VIBRAMYCIN) 100 MG capsule Take 1 capsule (100 mg total) by mouth 2 (two) times daily for 7 days. 14 capsule Talbot Grumbling, FNP      PDMP not reviewed this encounter.   Talbot Grumbling, [place] 02/12/23 1354

## 2023-04-11 ENCOUNTER — Emergency Department (HOSPITAL_BASED_OUTPATIENT_CLINIC_OR_DEPARTMENT_OTHER)
Admission: EM | Admit: 2023-04-11 | Discharge: 2023-04-11 | Disposition: A | Discharge status: Home / Self Care | Payer: Medicaid Other | Attending: Emergency Medicine | Admitting: Emergency Medicine

## 2023-04-11 ENCOUNTER — Encounter (HOSPITAL_BASED_OUTPATIENT_CLINIC_OR_DEPARTMENT_OTHER): Payer: Self-pay

## 2023-04-11 ENCOUNTER — Other Ambulatory Visit: Payer: Self-pay

## 2023-04-11 DIAGNOSIS — Z20822 Contact with and (suspected) exposure to covid-19: Secondary | ICD-10-CM | POA: Diagnosis not present

## 2023-04-11 DIAGNOSIS — J069 Acute upper respiratory infection, unspecified: Secondary | ICD-10-CM | POA: Diagnosis not present

## 2023-04-11 DIAGNOSIS — R059 Cough, unspecified: Secondary | ICD-10-CM | POA: Diagnosis present

## 2023-04-11 LAB — SARS CORONAVIRUS 2 BY RT PCR: SARS Coronavirus 2 by RT PCR: NEGATIVE

## 2023-04-11 LAB — GROUP A STREP BY PCR: Group A Strep by PCR: NOT DETECTED

## 2023-04-11 MED ORDER — BENZONATATE 100 MG PO CAPS: 100.0000 mg | ORAL_CAPSULE | Freq: Three times a day (TID) | ORAL | 0 refills | Status: DC | PRN

## 2023-04-11 MED ORDER — BENZONATATE 100 MG PO CAPS
100.0000 mg | ORAL_CAPSULE | Freq: Once | ORAL | Status: AC
  Administered 2023-04-11: 100 mg via ORAL
  Filled 2023-04-11: qty 1

## 2023-04-11 MED ORDER — IBUPROFEN 800 MG PO TABS
800.0000 mg | ORAL_TABLET | Freq: Once | ORAL | Status: AC
  Administered 2023-04-11: 800 mg via ORAL
  Filled 2023-04-11: qty 1

## 2023-04-11 NOTE — ED Triage Notes (Signed)
Pt reports he has gen body aches, sore throat, and cough. Pt's youngest child is sick. Pt took his temp prior to coming in and it was 101. Did not take meds. No other complaints.

## 2023-04-11 NOTE — ED Provider Notes (Signed)
[facility] Provider Note   CSN: [id number] Arrival date & time: 04/11/23  0058     History  Chief Complaint  Patient presents with   Generalized Body Aches   Cough   Sore Throat    Garrett Carrillo is a 32 y.o. male.  The history is provided by the patient and medical records.  Cough Sore Throat  Garrett Carrillo is a 32 y.o. male who presents to the Emergency Department complaining of cough.  He presents the emergency department complaining of cough for the last 4 days with associated body aches, sore throat.  He did have a temperature to 101 at home today but did not take any medications.  He states that there is blood when he blows his nose.  No abdominal pain, nausea, vomiting.  He does have sick contacts in the home with similar symptoms.  He has no known medical problems.       Home Medications Prior to Admission medications   Medication Sig Start Date End Date Taking? Authorizing Provider  benzonatate (TESSALON) 100 MG capsule Take 1 capsule (100 mg total) by mouth 3 (three) times daily as needed for cough. 04/11/23  Yes Tilden Fossa, MD  ciclopirox (LOPROX) 0.77 % cream SMARTSIG:Topical Morning-Evening 03/07/22   [provider]  fluconazole (DIFLUCAN) 150 MG tablet Take 150 mg by mouth 2 (two) times a week. 04/11/22   [provider]  hydrOXYzine (ATARAX) 25 MG tablet Take 25 mg by mouth 2 (two) times daily. 04/11/22   [provider]  ibuprofen (ADVIL) 800 MG tablet Take 1 tablet (800 mg total) by mouth every 8 (eight) hours as needed. 06/27/22   Raspet, Noberto Retort, PA-C  omeprazole (PRILOSEC) 20 MG capsule Take 20 mg by mouth daily. 04/25/22   [provider]  prazosin (MINIPRESS) 1 MG capsule Take 1 mg by mouth at bedtime. 04/11/22   [provider]  risperiDONE (RISPERDAL) 0.25 MG tablet Take 0.25 mg by mouth 2 (two) times daily as needed. 05/10/22   [provider]  UBRELVY  100 MG TABS Take 1 tablet by mouth as needed. 05/08/22   [provider]  VRAYLAR 1.5 MG capsule Take 1.5 mg by mouth daily. 03/07/22   [provider]  VRAYLAR 3 MG capsule Take 3 mg by mouth daily. 04/25/22   [provider]      Allergies    Penicillins and Sulfa antibiotics    Review of Systems   Review of Systems  Respiratory:  Positive for cough.   All other systems reviewed and are negative.   Physical Exam Updated Vital Signs BP 119/62 (BP Location: Right Arm)   Pulse 74   Temp 98.3 F (36.8 C) (Oral)   Resp 18   Ht 6\' 1"  (1.854 m)   Wt 132.9 kg   SpO2 97%   BMI 38.66 kg/m  Physical Exam Vitals and nursing note reviewed.  Constitutional:      Appearance: He is well-developed.  HENT:     Head: Normocephalic and atraumatic.     Nose: Rhinorrhea present.     Mouth/Throat:     Mouth: Mucous membranes are moist.     Pharynx: No oropharyngeal exudate or posterior oropharyngeal erythema.  Cardiovascular:     Rate and Rhythm: Normal rate and regular rhythm.     Heart sounds: No murmur heard. Pulmonary:     Effort: Pulmonary effort is normal. No respiratory distress.  Breath sounds: Normal breath sounds.  Abdominal:     Palpations: Abdomen is soft.     Tenderness: There is no abdominal tenderness. There is no guarding or rebound.  Musculoskeletal:        General: No swelling or tenderness.  Skin:    General: Skin is warm and dry.  Neurological:     Mental Status: He is alert and oriented to person, place, and time.  Psychiatric:        Behavior: Behavior normal.     ED Results / Procedures / Treatments   Labs (all labs ordered are listed, but only abnormal results are displayed) Labs Reviewed  GROUP A STREP BY PCR  SARS CORONAVIRUS 2 BY RT PCR    EKG None  Radiology No results found.  Procedures Procedures    Medications Ordered in ED Medications  ibuprofen (ADVIL) tablet 800 mg (800 mg Oral Given 04/11/23 0227)   benzonatate (TESSALON) capsule 100 mg (100 mg Oral Given 04/11/23 0227)    ED Course/ Medical Decision Making/ A&P                             Medical Decision Making Risk Prescription drug management.   Patient here for evaluation of cough, fever and bodyaches.  He is nontoxic-appearing on evaluation with no respiratory distress.  Lungs are clear bilaterally without respiratory distress.  No clinical evidence of pneumonia.  No evidence of RPA, PTA, epiglottitis on examination.  He is negative for COVID, strep.  Discussed with patient home care for viral URI with OTC medications for symptom management.  Will prescribe Tessalon Perles for cough as needed.  Discussed outpatient follow-up and return precautions.        Final Clinical Impression(s) / ED Diagnoses Final diagnoses:  Viral URI with cough    Rx / DC Orders ED Discharge Orders          Ordered    benzonatate (TESSALON) 100 MG capsule  3 times daily PRN        04/11/23 1610              Tilden Fossa, MD 04/11/23 [phone]

## 2023-04-16 ENCOUNTER — Encounter (HOSPITAL_COMMUNITY): Payer: Self-pay | Admitting: *Deleted

## 2023-04-16 ENCOUNTER — Ambulatory Visit (HOSPITAL_COMMUNITY)
Admission: EM | Admit: 2023-04-16 | Discharge: 2023-04-16 | Disposition: A | Discharge status: Home / Self Care | Payer: Medicaid Other | Attending: Emergency Medicine | Admitting: Emergency Medicine

## 2023-04-16 ENCOUNTER — Other Ambulatory Visit: Payer: Self-pay

## 2023-04-16 DIAGNOSIS — H109 Unspecified conjunctivitis: Secondary | ICD-10-CM

## 2023-04-16 MED ORDER — MOXIFLOXACIN HCL 0.5 % OP SOLN: 1.0000 [drp] | Freq: Three times a day (TID) | OPHTHALMIC | 0 refills | Status: DC

## 2023-04-16 NOTE — Discharge Instructions (Addendum)
Please use the eyedrops 3 times daily for the next 5 to 6 days. Symptoms should improve in the next day or so  If you develop any green/mucus discharge of the right eye, you can then start using the drops on that eye as well.  Otherwise just use them in the left eye.

## 2023-04-16 NOTE — ED Provider Notes (Signed)
[facility]    CSN: [id number] Arrival date & time: 04/16/23  0941      History   Chief Complaint Chief Complaint  Patient presents with   Eye Drainage    HPI Garrett Carrillo is a 32 y.o. male.  Here with concerns for pink eye. His sister noticed his eye looked red a few days ago.  This morning developed some green mucus discharge from the left eye. Not having any vision changes.  No fever. Eye is not itchy or painful.  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 Authorizing Provider  moxifloxacin (VIGAMOX) 0.5 % ophthalmic solution Place 1 drop into the left eye 3 (three) times daily. 04/16/23  Yes Rifka Ramey, Lurena Joiner, PA-C  fluconazole (DIFLUCAN) 150 MG tablet Take 150 mg by mouth 2 (two) times a week. 04/11/22   [provider]  VRAYLAR 3 MG capsule Take 3 mg by mouth daily. 04/25/22   [provider]    Family History Family History  Adopted: Yes  Problem Relation Age of Onset   Hypertension  Mother    Hypertension Maternal Grandmother    Hypertension Maternal Grandfather    Hypertension Paternal Grandmother    Hypertension Paternal Grandfather    Seizures Neg Hx     Social History Social History   Tobacco Use   Smoking status: Every Day    Packs/day: 1.00    Years: 10.00    Additional pack years: 0.00    Total pack years: 10.00    Types: Cigarettes   Smokeless tobacco: Never  Vaping Use   Vaping Use: Never used  Substance Use Topics   Alcohol use: Yes    Comment: occasional   Drug use: Not Currently    Types: Marijuana, Benzodiazepines     Allergies   Penicillins and Sulfa antibiotics   Review of Systems Review of Systems Per HPI  Physical Exam Triage Vital Signs ED Triage Vitals  Enc Vitals Group     BP 04/16/23 1014 124/83     Pulse Rate 04/16/23 1014 67     Resp 04/16/23 1014 18     Temp 04/16/23 1014 97.9 F (36.6 C)     Temp src --      SpO2 04/16/23 1014 97 %     Weight --      Height --      Head Circumference --      Peak Flow --  Pain Score 04/16/23 1012 0     Pain Loc --      Pain Edu? --      Excl. in GC? --    No data found.  Updated Vital Signs BP 124/83   Pulse 67   Temp 97.9 F (36.6 C)   Resp 18   SpO2 97%    Physical Exam Vitals and nursing note reviewed.  Constitutional:      General: He is not in acute distress. HENT:     Nose: No rhinorrhea.     Mouth/Throat:     Mouth: Mucous membranes are moist.     Pharynx: Oropharynx is clear. No posterior oropharyngeal erythema.  Eyes:     General:        Left eye: Discharge present.    Extraocular Movements: Extraocular movements intact.     Pupils: Pupils are equal, round, and reactive to light.     Comments: Left eye mildly injected. There is a small amount of green discharge left eye only  Cardiovascular:     Rate and Rhythm: Normal rate and regular rhythm.  Pulmonary:     Effort: Pulmonary effort is normal.  Musculoskeletal:     Cervical back: Normal  range of motion.  Skin:    General: Skin is warm and dry.  Neurological:     Mental Status: He is alert and oriented to person, place, and time.     UC Treatments / Results  Labs (all labs ordered are listed, but only abnormal results are displayed) Labs Reviewed - No data to display  EKG   Radiology No results found.  Procedures Procedures (including critical care time)  Medications Ordered in UC Medications - No data to display  Initial Impression / Assessment and Plan / UC Course  I have reviewed the triage vital signs and the nursing notes.  Pertinent labs & imaging results that were available during my care of the patient were reviewed by me and considered in my medical decision making (see chart for details).  Acute bacterial conjunctivitis of the left eye Vigamox 3 times daily Discussed return precautions.  Work note provided.  Patient agreeable to plan.  Final Clinical Impressions(s) / UC Diagnoses   Final diagnoses:  Bacterial conjunctivitis of left eye     Discharge Instructions      Please use the eyedrops 3 times daily for the next 5 to 6 days. Symptoms should improve in the next day or so  If you develop any green/mucus discharge of the right eye, you can then start using the drops on that eye as well.  Otherwise just use them in the left eye.    ED Prescriptions     Medication Sig Dispense Auth. Provider   moxifloxacin (VIGAMOX) 0.5 % ophthalmic solution Place 1 drop into the left eye 3 (three) times daily. 3 mL Xayne Brumbaugh, Lurena Joiner, PA-C      PDMP not reviewed this encounter.   Norissa Bartee, Lurena Joiner, PA-C 04/16/23 1027

## 2023-04-16 NOTE — ED Triage Notes (Signed)
Pt reports his ey eis pink and he had mucous in his eye this morning.

## 2023-07-03 ENCOUNTER — Ambulatory Visit (HOSPITAL_COMMUNITY)
Admission: EM | Admit: 2023-07-03 | Discharge: 2023-07-03 | Disposition: A | Discharge status: Home / Self Care | Payer: MEDICAID | Attending: Emergency Medicine | Admitting: Emergency Medicine

## 2023-07-03 ENCOUNTER — Encounter (HOSPITAL_COMMUNITY): Payer: Self-pay | Admitting: Emergency Medicine

## 2023-07-03 ENCOUNTER — Other Ambulatory Visit: Payer: Self-pay

## 2023-07-03 DIAGNOSIS — S46011A Strain of muscle(s) and tendon(s) of the rotator cuff of right shoulder, initial encounter: Secondary | ICD-10-CM | POA: Diagnosis not present

## 2023-07-03 DIAGNOSIS — S46311A Strain of muscle, fascia and tendon of triceps, right arm, initial encounter: Secondary | ICD-10-CM

## 2023-07-03 MED ORDER — METHYLPREDNISOLONE 4 MG PO TBPK: ORAL_TABLET | Freq: Every day | ORAL | 0 refills | Status: DC

## 2023-07-03 MED ORDER — NAPROXEN 500 MG PO TABS: 500.0000 mg | ORAL_TABLET | Freq: Two times a day (BID) | ORAL | 0 refills | Status: DC
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
## 2023-07-03 NOTE — ED Triage Notes (Signed)
Pt c/o right arm pain since yesterday, denies any fall or injury.

## 2023-07-03 NOTE — Discharge Instructions (Signed)
I suspect you have strained your rotator cuff and triceps.  Wear the shoulder immobilizer as needed for comfort.  I would rest it for at least the next 5 to 7 days.  Take the Naprosyn 1000 mg of Tylenol twice a day.  Ice your shoulder and triceps after use.  The Medrol Dosepak will help with pain and swelling.  I have put in a referral to [facility] sports medicine, or you can follow-up with [facility] if not better in 7 to 10 days with conservative treatment.

## 2023-07-11 ENCOUNTER — Encounter: Payer: Self-pay | Admitting: Family Medicine

## 2023-07-11 ENCOUNTER — Ambulatory Visit (INDEPENDENT_AMBULATORY_CARE_PROVIDER_SITE_OTHER): Payer: MEDICAID | Admitting: Family Medicine

## 2023-07-11 VITALS — BP 119/78 | Ht 74.5 in | Wt 289.0 lb

## 2023-07-11 DIAGNOSIS — M79601 Pain in right arm: Secondary | ICD-10-CM

## 2023-07-11 DIAGNOSIS — M25511 Pain in right shoulder: Secondary | ICD-10-CM | POA: Insufficient documentation

## 2023-07-11 NOTE — Patient Instructions (Signed)
Wear sling for comfort Alternate Tylenol 1g at breakfast lunch and dinner, and alternate naproxen or ibuprofen in between Ice at least once a day at night for a minimum of 20 minutes Complete x-ray Follow-up next week for ultrasound
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
## 2023-07-11 NOTE — Assessment & Plan Note (Addendum)
Korben's exam and history are concerning for new labral tear versus aggravation of old injury from football. He can continue to wear sling for comfort but encouraged to maintain active range of motion. Alternate Tylenol 1g at breakfast lunch and dinner, and alternate naproxen or ibuprofen in between Ice at least once a day at night for a minimum of 20 minutes x-ray pending.  We will call him with results. Follow-up next week for full ultrasound of the shoulder.

## 2023-07-18 ENCOUNTER — Other Ambulatory Visit: Payer: MEDICAID | Admitting: Family Medicine

## 2023-07-20 ENCOUNTER — Other Ambulatory Visit: Payer: MEDICAID | Admitting: Family Medicine

## 2023-07-27 ENCOUNTER — Other Ambulatory Visit: Payer: MEDICAID | Admitting: Family Medicine

## 2023-08-09 DIAGNOSIS — R1010 Upper abdominal pain, unspecified: Secondary | ICD-10-CM | POA: Diagnosis present

## 2023-08-09 DIAGNOSIS — R519 Headache, unspecified: Secondary | ICD-10-CM | POA: Insufficient documentation

## 2023-08-09 DIAGNOSIS — R0981 Nasal congestion: Secondary | ICD-10-CM | POA: Insufficient documentation

## 2023-08-09 NOTE — ED Triage Notes (Signed)
Pt c/o abdominal pain, heart burn, congestion and cough x 2 days ago

## 2023-08-10 ENCOUNTER — Encounter (HOSPITAL_BASED_OUTPATIENT_CLINIC_OR_DEPARTMENT_OTHER): Payer: Self-pay

## 2023-08-10 ENCOUNTER — Emergency Department (HOSPITAL_BASED_OUTPATIENT_CLINIC_OR_DEPARTMENT_OTHER)
Admission: EM | Admit: 2023-08-10 | Discharge: 2023-08-10 | Disposition: A | Discharge status: Home / Self Care | Payer: MEDICAID | Attending: Emergency Medicine | Admitting: Emergency Medicine

## 2023-08-10 DIAGNOSIS — J019 Acute sinusitis, unspecified: Secondary | ICD-10-CM

## 2023-08-10 DIAGNOSIS — K219 Gastro-esophageal reflux disease without esophagitis: Secondary | ICD-10-CM

## 2023-08-10 LAB — CBC
HCT: 39.5 % (ref 39.0–52.0)
Hemoglobin: 13.1 g/dL (ref 13.0–17.0)
MCH: 29.4 pg (ref 26.0–34.0)
MCHC: 33.2 g/dL (ref 30.0–36.0)
MCV: 88.8 fL (ref 80.0–100.0)
Platelets: 245 10*3/uL (ref 150–400)
RBC: 4.45 MIL/uL (ref 4.22–5.81)
RDW: 14.5 % (ref 11.5–15.5)
WBC: 7.9 10*3/uL (ref 4.0–10.5)
nRBC: 0 % (ref 0.0–0.2)

## 2023-08-10 LAB — URINALYSIS, ROUTINE W REFLEX MICROSCOPIC
Bilirubin Urine: NEGATIVE
Glucose, UA: NEGATIVE mg/dL
Hgb urine dipstick: NEGATIVE
Ketones, ur: NEGATIVE mg/dL
Leukocytes,Ua: NEGATIVE
Nitrite: NEGATIVE
Protein, ur: NEGATIVE mg/dL
Specific Gravity, Urine: 1.025 (ref 1.005–1.030)
pH: 6 (ref 5.0–8.0)

## 2023-08-10 LAB — COMPREHENSIVE METABOLIC PANEL
ALT: 18 U/L (ref 0–44)
AST: 16 U/L (ref 15–41)
Albumin: 3.9 g/dL (ref 3.5–5.0)
Alkaline Phosphatase: 65 U/L (ref 38–126)
Anion gap: 9 (ref 5–15)
BUN: 12 mg/dL (ref 6–20)
CO2: 24 mmol/L (ref 22–32)
Calcium: 8.7 mg/dL — ABNORMAL LOW (ref 8.9–10.3)
Chloride: 106 mmol/L (ref 98–111)
Creatinine, Ser: 0.96 mg/dL (ref 0.61–1.24)
GFR, Estimated: >60 mL/min (ref >60)
Glucose, Bld: 131 mg/dL — ABNORMAL HIGH (ref 70–99)
Potassium: 3.4 mmol/L — ABNORMAL LOW (ref 3.5–5.1)
Sodium: 139 mmol/L (ref 135–145)
Total Bilirubin: 0.4 mg/dL (ref 0.3–1.2)
Total Protein: 6.7 g/dL (ref 6.5–8.1)

## 2023-08-10 LAB — LIPASE, BLOOD: Lipase: 22 U/L (ref 11–51)

## 2023-08-10 MED ORDER — DOXYCYCLINE HYCLATE 100 MG PO CAPS: 100.0000 mg | ORAL_CAPSULE | Freq: Two times a day (BID) | ORAL | 0 refills | Status: DC

## 2023-08-10 MED ORDER — DOXYCYCLINE HYCLATE 100 MG PO TABS
100.0000 mg | ORAL_TABLET | Freq: Once | ORAL | Status: AC
  Administered 2023-08-10: 100 mg via ORAL
  Filled 2023-08-10: qty 1

## 2023-08-10 MED ORDER — MUPIROCIN CALCIUM 2 % EX CREA: 1.0000 | TOPICAL_CREAM | Freq: Two times a day (BID) | CUTANEOUS | 0 refills | Status: AC

## 2023-08-10 NOTE — Discharge Instructions (Signed)
Begin taking omeprazole 20 mg twice daily for the next 2 weeks, then once daily thereafter.  This medication is available over-the-counter.  Begin taking doxycycline as prescribed.  Apply Bactroban ointment to the tissues around your nose.  Do this twice daily.  Follow-up with primary doctor if symptoms are not improving.

## 2023-08-10 NOTE — ED Provider Notes (Signed)
[facility] Provider Note   CSN: [id number] Arrival date & time: 08/09/23  2353     History  Chief Complaint  Patient presents with   Abdominal Pain    Garrett Carrillo is a 32 y.o. male.  Patient is a 32 year old male with history of ADHD, depression.  Patient presenting today for evaluation of abdominal discomfort, nasal congestion, and burning around his nose.  This has been worsening over the past week.  He describes discomfort in his upper abdomen that he describes as a burning sensation.  This is worse when he attempts to eat.  He denies any vomiting or diarrhea.  No fevers or chills.  He also describes sinus pressure, nasal congestion, and rhinorrhea that has been worsening.  It is causing the tissue around his nose to become inflamed and irritated.  No alleviating factors.  The history is provided by the patient.       Home Medications Prior to Admission medications   Medication Sig Start Date End Date Taking? Authorizing Provider  methylPREDNISolone (MEDROL DOSEPAK) 4 MG TBPK tablet Take by mouth daily. Follow package instructions 07/03/23   Domenick Gong, MD  naproxen (NAPROSYN) 500 MG tablet Take 1 tablet (500 mg total) by mouth 2 (two) times daily. 07/03/23   Domenick Gong, MD  VRAYLAR 3 MG capsule Take 3 mg by mouth daily. 04/25/22   [provider]      Allergies    Penicillins and Sulfa antibiotics    Review of Systems   Review of Systems  All other systems reviewed and are negative.   Physical Exam Updated Vital Signs BP 126/71 (BP Location: Left Arm)   Pulse 92   Temp 98.2 F (36.8 C) (Oral)   Resp 16   Ht 6\' 2"  (1.88 m)   Wt (!) 136.5 kg   SpO2 96%   BMI 38.65 kg/m  Physical Exam Vitals and nursing note reviewed.  Constitutional:      General: He is not in acute distress.    Appearance: He is well-developed. He is not diaphoretic.  HENT:     Head: Normocephalic and atraumatic.      Comments: There is clear nasal discharge coming from the nares.  There is some erythema and redness on the tissues surrounding the nares. Cardiovascular:     Rate and Rhythm: Normal rate and regular rhythm.     Heart sounds: No murmur heard.    No friction rub.  Pulmonary:     Effort: Pulmonary effort is normal. No respiratory distress.     Breath sounds: Normal breath sounds. No wheezing or rales.  Abdominal:     General: Bowel sounds are normal. There is no distension.     Palpations: Abdomen is soft.     Tenderness: There is no abdominal tenderness.  Musculoskeletal:        General: Normal range of motion.     Cervical back: Normal range of motion and neck supple.  Skin:    General: Skin is warm and dry.  Neurological:     Mental Status: He is alert and oriented to person, place, and time.     Coordination: Coordination normal.     ED Results / Procedures / Treatments   Labs (all labs ordered are listed, but only abnormal results are displayed) Labs Reviewed  COMPREHENSIVE METABOLIC PANEL - Abnormal; Notable for the following components:      Result Value   Potassium 3.4 (*)  Glucose, Bld 131 (*)    Calcium 8.7 (*)    All other components within normal limits  LIPASE, BLOOD  CBC  URINALYSIS, ROUTINE W REFLEX MICROSCOPIC    EKG EKG Interpretation Date/Time:  Friday August 10 2023 00:01:16 EDT Ventricular Rate:  85 PR Interval:  128 QRS Duration:  95 QT Interval:  362 QTC Calculation: 431 R Axis:   -41  Text Interpretation: Sinus rhythm Left axis deviation Confirmed by Geoffery Lyons ([id number]) on 08/10/2023 12:41:06 AM  Radiology No results found.  Procedures Procedures    Medications Ordered in ED Medications - No data to display  ED Course/ Medical Decision Making/ A&P  Patient presenting with abdominal discomfort as described in the HPI.  He arrives here with stable vital signs, is afebrile, and abdominal exam is benign.  Workup initiated including  CBC, CMP, lipase, and urinalysis.  All of which are unremarkable.  I highly suspect GERD and do not feel as though any imaging studies are indicated.  I will recommend omeprazole twice daily and have patient follow-up with primary doctor if not improving.  He also describes nasal congestion and drainage as well as irritation around his nares.  I will give him Bactroban for the skin around his nares as well as a prescription for doxycycline for possible sinus infection.  Patient to follow-up.  Final Clinical Impression(s) / ED Diagnoses Final diagnoses:  None    Rx / DC Orders ED Discharge Orders     None         Geoffery Lyons, MD 08/10/23 [phone]

## 2023-08-22 ENCOUNTER — Ambulatory Visit (HOSPITAL_COMMUNITY)
Admission: EM | Admit: 2023-08-22 | Discharge: 2023-08-22 | Disposition: A | Discharge status: Home / Self Care | Payer: MEDICAID | Attending: Sports Medicine | Admitting: Sports Medicine

## 2023-08-22 ENCOUNTER — Encounter (HOSPITAL_COMMUNITY): Payer: Self-pay | Admitting: Emergency Medicine

## 2023-08-22 DIAGNOSIS — S91201A Unspecified open wound of right great toe with damage to nail, initial encounter: Secondary | ICD-10-CM

## 2023-08-22 DIAGNOSIS — S91209A Unspecified open wound of unspecified toe(s) with damage to nail, initial encounter: Secondary | ICD-10-CM | POA: Diagnosis not present

## 2023-08-22 NOTE — ED Triage Notes (Signed)
Provider triaged.  

## 2023-08-22 NOTE — Discharge Instructions (Signed)
You had a right great toenail avulsion.  We did a partial nail avulsion procedure today to remove the remaining nail. I advise soaking the toe in warm water for 15 minutes tonight. Replace the current bandaging after this soak and keep bandaged and padded with the non-adhesive pads provided wrapped with Coban.  Return if there is red swelling or drainage of the toe in the next week.

## 2023-08-22 NOTE — ED Provider Notes (Signed)
[facility]    CSN: [id number] Arrival date & time: 08/22/23  1021      History   Chief Complaint Chief Complaint  Patient presents with   Toe Injury    HPI Garrett Carrillo is a 32 y.o. male.   Garrett Carrillo is a 32yo male here for evaluation of pain with his right great toe nail. 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 He states that at rest his toe isn't painful but when he is wearing shoes/socks this is pulling on the toenail and is painful. He denies swelling or drainage. No other complaints.  The history is provided by the patient.    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 Authorizing Provider  doxycycline (VIBRAMYCIN) 100 MG capsule Take 1 capsule (100 mg total) by mouth 2 (two) times daily. One po bid x 7 days 08/10/23   Geoffery Lyons, MD  methylPREDNISolone (MEDROL DOSEPAK) 4 MG TBPK tablet Take by mouth daily. Follow package instructions 07/03/23   Domenick Gong,  MD  mupirocin cream (BACTROBAN) 2 % Apply 1 Application topically 2 (two) times daily. 08/10/23   Geoffery Lyons, MD  naproxen (NAPROSYN) 500 MG tablet Take 1 tablet (500 mg total) by mouth 2 (two) times daily. 07/03/23   Domenick Gong, MD  VRAYLAR 3 MG capsule Take 3 mg by mouth daily. 04/25/22   [provider]    Family History Family History  Adopted: Yes  Problem Relation Age of Onset   Hypertension Mother    Hypertension Maternal Grandmother    Hypertension Maternal Grandfather    Hypertension Paternal Grandmother    Hypertension Paternal Grandfather    Seizures Neg Hx     Social History Social History   Tobacco Use   Smoking status: Every Day    Current packs/day: 1.00    Average packs/day: 1 pack/day for 10.0 years (10.0 ttl pk-yrs)    Types: Cigarettes   Smokeless tobacco: Never  Vaping Use   Vaping status: Never Used  Substance Use Topics   Alcohol use: Yes    Comment: occasional   Drug use: Yes    Types: Marijuana    Comment: occ     Allergies   Penicillins and Sulfa antibiotics   Review of Systems Review of Systems  Constitutional:  Negative for chills and fever.  Musculoskeletal:  Negative for joint swelling.  Skin:  Negative for rash.       Toe Nail Avulsion     Physical Exam Triage Vital Signs ED Triage Vitals  Encounter Vitals Group     BP 08/22/23 1116 120/79     Systolic BP Percentile --      Diastolic BP Percentile --      Pulse Rate 08/22/23 1116 87     Resp 08/22/23 1116 16     Temp 08/22/23 1116 98.6 F (37 C)     Temp Source 08/22/23 1116 Oral     SpO2 08/22/23 1116 95 %     Weight --      Height --      Head Circumference --      Peak Flow --      Pain Score 08/22/23 1117 0     Pain Loc --      Pain Education --      Exclude from Growth Chart --    No data found.  Updated Vital Signs BP 120/79 (BP Location: Right Arm)   Pulse 87   Temp 98.6 F (37 C) (Oral)   Resp 16   SpO2 95%   Visual Acuity Right  Eye Distance:   Left Eye Distance:   Bilateral Distance:    Right Eye Near:   Left Eye Near:    Bilateral Near:     Physical Exam Constitutional:      General: He is not in acute distress.    Appearance: Normal appearance.  Cardiovascular:     Rate and Rhythm: Normal rate.  Pulmonary:     Effort: Pulmonary effort is normal.  Musculoskeletal:     Comments: Right Foot without gross deformity, swelling, or redness. Great toe with full painless ROM.  Near complete (90%) avulsion of the left great toenail with remaining attachment at nailbed on the medial border. Toe nail is thickened and yellow. No active bleeding or swelling at the nail bed. No drainage or collection of pus/fluid. Some flaky dry skin surrounding the medial and lateral nail folds. He is tender with manipulation of the toenail at the nailbed.  Neurological:     Mental Status: He is alert.      UC Treatments / Results  Labs (all labs ordered are listed, but only abnormal results are displayed) Labs Reviewed - No data to display  EKG   Radiology No results found.  Procedures Partially avulsed toe nail removal Reviewed risks and benefits of the procedure and patient gave verbal consent to proceed. Given only about 10% of the nail is still attached to the nailbed we discussed doing the procedure with or without local anesthesia as the process of anesthetizing may be as uncomfortable as the procedure itself. Patient elected to proceed without local anesthesia. His toe was positioned the base of the nailbed was cleaned with alcohol swabs. The raised nail was grasped with hemostats and distal traction applied. Toenail clippers were used to cut the remaining base of the nail. The partial segment of nail still attached to the nailbed was removed intact with a pair of hemostats. Topical antibiotic ointment applied and a non-adherent pad placed. This was wrapped with coban to provided padding. The patient tolerated the  procedure well without significant bleeding. Post-procedural wound care instructions were reviewed and provided to him.    Medications Ordered in UC Medications - No data to display  Initial Impression / Assessment and Plan / UC Course  I have reviewed the triage vital signs and the  nursing notes.  Pertinent labs & imaging results that were available during my care of the patient were reviewed by me and considered in my medical decision making (see chart for details).    Patient with partial right great toenail avulsion underwent complete avulsion procedure as described above. We reviewed protection and wound care measures moving forward as his toenail grows back. Reviewed signs and symptoms of infection to monitor and provided return precautions. Patient expressed understanding and is in agreement with this plan.   Final Clinical Impressions(s) / UC Diagnoses   Final diagnoses:  Avulsion of toenail of right foot     Discharge Instructions      You had a right great toenail avulsion.  We did a partial nail avulsion procedure today to remove the remaining nail. I advise soaking the toe in warm water for 15 minutes tonight. Replace the current bandaging after this soak and keep bandaged and padded with the non-adhesive pads provided wrapped with Coban.  Return if there is red swelling or drainage of the toe in the next week.     ED Prescriptions   None    PDMP not reviewed this encounter.   Marisa Cyphers, MD 08/22/23 [phone]

## 2024-01-20 ENCOUNTER — Encounter (HOSPITAL_COMMUNITY): Payer: Self-pay

## 2024-01-20 ENCOUNTER — Emergency Department (HOSPITAL_COMMUNITY): Payer: MEDICAID

## 2024-01-20 ENCOUNTER — Emergency Department (HOSPITAL_COMMUNITY)
Admission: EM | Admit: 2024-01-20 | Discharge: 2024-01-20 | Disposition: A | Discharge status: Home / Self Care | Payer: MEDICAID | Attending: Emergency Medicine | Admitting: Emergency Medicine

## 2024-01-20 ENCOUNTER — Other Ambulatory Visit: Payer: Self-pay

## 2024-01-20 DIAGNOSIS — Y92410 Unspecified street and highway as the place of occurrence of the external cause: Secondary | ICD-10-CM | POA: Insufficient documentation

## 2024-01-20 DIAGNOSIS — M546 Pain in thoracic spine: Secondary | ICD-10-CM | POA: Diagnosis not present

## 2024-01-20 DIAGNOSIS — M25562 Pain in left knee: Secondary | ICD-10-CM | POA: Insufficient documentation

## 2024-01-20 DIAGNOSIS — F17210 Nicotine dependence, cigarettes, uncomplicated: Secondary | ICD-10-CM | POA: Diagnosis not present
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
## 2024-01-20 NOTE — Discharge Instructions (Addendum)
 Garrett Carrillo:  Thank you for allowing [place] to take care of you today.  We hope you begin feeling better soon.  To-Do: Please follow-up with your primary doctor to discuss any findings from today's emergency department visit. Please return to the Emergency Department or call 911 if you experience chest pain, shortness of breath, severe pain, severe fever, altered mental status, or have any reason to think that you need emergency medical care.  Thank you again.  Hope you feel better soon.  Department of Emergency Medicine [facility]

## 2024-03-18 ENCOUNTER — Encounter (HOSPITAL_BASED_OUTPATIENT_CLINIC_OR_DEPARTMENT_OTHER): Payer: Self-pay | Admitting: Internal Medicine

## 2024-03-18 DIAGNOSIS — R0681 Apnea, not elsewhere classified: Secondary | ICD-10-CM

## 2024-03-18 DIAGNOSIS — R0683 Snoring: Secondary | ICD-10-CM

## 2024-04-26 ENCOUNTER — Ambulatory Visit
Admission: EM | Admit: 2024-04-26 | Discharge: 2024-04-26 | Disposition: A | Discharge status: Home / Self Care | Payer: MEDICAID | Attending: Internal Medicine | Admitting: Internal Medicine

## 2024-04-26 ENCOUNTER — Ambulatory Visit: Payer: Self-pay

## 2024-04-26 DIAGNOSIS — L0102 Bockhart's impetigo: Secondary | ICD-10-CM | POA: Diagnosis not present

## 2024-04-26 DIAGNOSIS — L0201 Cutaneous abscess of face: Secondary | ICD-10-CM

## 2024-04-26 MED ORDER — IBUPROFEN 800 MG PO TABS: 800.0000 mg | ORAL_TABLET | Freq: Three times a day (TID) | ORAL | 0 refills | Status: DC

## 2024-04-26 MED ORDER — DOXYCYCLINE HYCLATE 100 MG PO CAPS: 100.0000 mg | ORAL_CAPSULE | Freq: Two times a day (BID) | ORAL | 0 refills | Status: AC

## 2024-04-26 MED ORDER — ACETAMINOPHEN 500 MG PO TABS: 1000.0000 mg | ORAL_TABLET | Freq: Four times a day (QID) | ORAL | 0 refills | Status: DC | PRN

## 2024-04-26 NOTE — Discharge Instructions (Signed)

## 2024-04-26 NOTE — ED Provider Notes (Signed)
 [facility]    CSN: [id number] Arrival date & time: 04/26/24  1433      History   Chief Complaint Chief Complaint  Patient presents with   Abscess    HPI Garrett Carrillo is a 33 y.o. male.   Garrett Carrillo is a 33 y.o. male presenting for chief complaint of Abscess to the right cheek that started a few days ago. He went to his barber a week ago and had the lines of his beard cleaned up and shaved.  Abscess developed shortly after and is very tender. Area started out as a small pimple and has grown significantly in size and pain over the last few days. He has not noticed any drainage from the lesion. Denies N/V, body aches, fever/chills, difficulty swallowing and eating, and headaches. Denies recent antibiotic use in the last 90 days. He has been using cold compresses without relief.  Allergic to penicillin (anaphylaxis) and sulfa (rash).    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 Authorizing Provider  acetaminophen   (TYLENOL ) 500 MG tablet Take 2 tablets (1,000 mg total) by mouth every 6 (six) hours as needed. 04/26/24  Yes Starlene Eaton, FNP  doxycycline  (VIBRAMYCIN ) 100 MG capsule Take 1 capsule (100 mg total) by mouth 2 (two) times daily. 04/26/24  Yes Starlene Eaton, FNP  ibuprofen  (ADVIL ) 800 MG tablet Take 1 tablet (800 mg total) by mouth 3 (three) times daily. 04/26/24  Yes Starlene Eaton, FNP  methylPREDNISolone  (MEDROL  DOSEPAK) 4 MG TBPK tablet Take by mouth daily. Follow package instructions 07/03/23   Ethlyn Herd, MD  mupirocin  cream (BACTROBAN ) 2 % Apply 1 Application topically 2 (two) times daily. 08/10/23   Orvilla Blander, MD  naproxen  (NAPROSYN ) 500 MG tablet Take 1 tablet (500 mg total) by mouth 2 (two) times daily. 07/03/23   Ethlyn Herd, MD  VRAYLAR 3 MG capsule Take 3 mg by mouth daily. 04/25/22   [provider]    Family History Family History  Adopted: Yes  Problem Relation Age of Onset   Hypertension Mother    Hypertension Maternal Grandmother    Hypertension Maternal Grandfather    Hypertension Paternal Grandmother    Hypertension Paternal Grandfather    Seizures Neg Hx     Social History Social History   Tobacco Use   Smoking status: Every Day    Current packs/day: 1.00    Average packs/day: 1 pack/day for 10.0  years (10.0 ttl pk-yrs)    Types: Cigarettes   Smokeless tobacco: Never  Vaping Use   Vaping status: Never Used  Substance Use Topics   Alcohol use: Yes    Comment: occasional   Drug use: Yes    Types: Marijuana    Comment: occ     Allergies   Penicillins and Sulfa antibiotics   Review of Systems Review of Systems Per HPI  Physical Exam Triage Vital Signs ED Triage Vitals  Encounter Vitals Group     BP 04/26/24 1516 117/77     Systolic BP Percentile --      Diastolic BP Percentile --      Pulse Rate 04/26/24 1516 85     Resp 04/26/24 1516 16     Temp 04/26/24 1516 98.1 F (36.7 C)     Temp Source 04/26/24  1516 Oral     SpO2 04/26/24 1516 94 %     Weight --      Height --      Head Circumference --      Peak Flow --      Pain Score 04/26/24 1507 5     Pain Loc --      Pain Education --      Exclude from Growth Chart --    No data found.  Updated Vital Signs BP 117/77 (BP Location: Right Arm)   Pulse 85   Temp 98.1 F (36.7 C) (Oral)   Resp 16   SpO2 94%   Visual Acuity Right Eye Distance:   Left Eye Distance:   Bilateral Distance:    Right Eye Near:   Left Eye Near:    Bilateral Near:     Physical Exam Vitals and nursing note reviewed.  Constitutional:      Appearance: He is not ill-appearing or toxic-appearing.  HENT:     Head: Normocephalic and atraumatic. No raccoon eyes or Battle's sign.      Right Ear: Hearing and external ear normal.     Left Ear: Hearing and external ear normal.     Nose: Nose normal.     Mouth/Throat:     Lips: Pink.  Eyes:     General: Lids are normal. Vision grossly intact. Gaze aligned appropriately.     Extraocular Movements: Extraocular movements intact.     Conjunctiva/sclera: Conjunctivae normal.  Pulmonary:     Effort: Pulmonary effort is normal.  Musculoskeletal:     Cervical back: Neck supple.  Skin:    General: Skin is warm and dry.     Capillary Refill: Capillary refill takes less than 2 seconds.     Findings: Abscess (see HEENT section) present. No rash.  Neurological:     General: No focal deficit present.     Mental Status: He is alert and oriented to person, place, and time. Mental status is at baseline.     Cranial Nerves: No dysarthria or facial asymmetry.  Psychiatric:        Mood and Affect: Mood normal.        Speech: Speech normal.        Behavior: Behavior normal.        Thought Content: Thought content normal.        Judgment: Judgment normal.      UC Treatments / Results  Labs (all labs ordered are listed, but only abnormal results are displayed) Labs Reviewed - No data to  display  EKG   Radiology No results found.  Procedures Procedures (including critical care time)  Medications Ordered in UC Medications - No data to display  Initial Impression / Assessment and Plan / UC Course  I have reviewed the triage vital signs and the nursing notes.  Pertinent labs & imaging results that were available during my care of the patient were reviewed by me and considered in my medical decision making (see chart for details).   1. Facial abscess, pustular folliculitis Abscess appears infected, though is immature and is not ready for drainage.  Doxycycline  antibiotic ordered, discussed supportive care and wound care as outlined in AVS. Return for I&D if abscess fails to drain on its own in the next 2-3 days with use of antibiotic and supportive care.   Counseled patient on potential for adverse effects with medications prescribed/recommended today, strict ER and return-to-clinic precautions discussed, patient verbalized understanding.    Final Clinical Impressions(s) / UC Diagnoses   Final diagnoses:  Pustular folliculitis  Facial abscess     Discharge Instructions      Your abscess has been evaluated in the clinic and appears to be infected, but is not quite ready for drainage.   I would like for you to perform warm compresses to the area frequently and continue taking over the counter medications for pain and inflammation as directed.   Start taking antibiotic sent to pharmacy as directed. This will help reduce infection to area and help the abscess mature/drain.   If abscess does not begin to drain on its own over the next few days and becomes softer, please return to urgent care for this to be drained appropriately.   If you have any fever, chills, nausea, vomiting, or worsening pain/swelling to the area, please return to urgent care for reevaluation.  ED Prescriptions     Medication Sig Dispense Auth. Provider   doxycycline  (VIBRAMYCIN ) 100 MG  capsule Take 1 capsule (100 mg total) by mouth 2 (two) times daily. 20 capsule Shella Devoid M, FNP   acetaminophen  (TYLENOL ) 500 MG tablet Take 2 tablets (1,000 mg total) by mouth every 6 (six) hours as needed. 30 tablet Shella Devoid M, FNP   ibuprofen  (ADVIL ) 800 MG tablet Take 1 tablet (800 mg total) by mouth 3 (three) times daily. 21 tablet Starlene Eaton, FNP      PDMP not reviewed this encounter.   Starlene Eaton, [place] 04/26/24 1926

## 2024-04-26 NOTE — ED Triage Notes (Signed)
 Pt states abscess to the right side of his face for the past 3 days. States he thinks it may be an ingrown hair.

## 2024-05-30 ENCOUNTER — Ambulatory Visit (HOSPITAL_BASED_OUTPATIENT_CLINIC_OR_DEPARTMENT_OTHER): Payer: MEDICAID | Attending: Nurse Practitioner | Admitting: Internal Medicine

## 2024-06-13 ENCOUNTER — Ambulatory Visit (INDEPENDENT_AMBULATORY_CARE_PROVIDER_SITE_OTHER): Payer: MEDICAID | Admitting: Podiatry

## 2024-06-13 DIAGNOSIS — B351 Tinea unguium: Secondary | ICD-10-CM

## 2024-06-13 DIAGNOSIS — B353 Tinea pedis: Secondary | ICD-10-CM

## 2024-06-13 DIAGNOSIS — M79671 Pain in right foot: Secondary | ICD-10-CM | POA: Diagnosis not present

## 2024-06-13 DIAGNOSIS — M79672 Pain in left foot: Secondary | ICD-10-CM

## 2024-06-13 MED ORDER — KETOCONAZOLE 2 % EX CREA
1.0000 | TOPICAL_CREAM | Freq: Every day | CUTANEOUS | 3 refills | Status: AC
Start: 2024-06-13 — End: 2024-07-13

## 2024-06-13 MED ORDER — TERBINAFINE HCL 250 MG PO TABS
250.0000 mg | ORAL_TABLET | Freq: Every day | ORAL | 0 refills | Status: AC
Start: 2024-06-13 — End: 2024-07-13

## 2024-06-13 NOTE — Addendum Note (Signed)
 Addended by: GIB MABLE SAUNDERS on: 06/13/2024 12:34 PM   Modules accepted: Orders

## 2024-06-13 NOTE — Progress Notes (Signed)
 Patient presents for evaluation and treatment of tenderness and some redness around nails feet.  Tenderness around toes with walking and wearing shoes.  Itching peeling rash with dry skin on the feet bilaterally.  Also drainage and maceration between the toes.  Physical exam:  General appearance: Alert, pleasant, and in no acute distress.  Vascular: Pedal pulses: DP 2/4 B/L, PT 2/4 B/L.  Mild edema lower legs bilaterally.  Capillary refill time immediate bilateral  Neurological:  Achilles tendon reflex intact.  Light touch intact.  Dermatologic:  Nails thickened, disfigured, discolored 1-5 BL with subungual debris.  Redness and hypertrophic nail folds along nail folds bilaterally but no signs of drainage or infection.  Dry scaly erythematous rash in a moccasin distribution.  Maceration and clear drainage between the toes with inflammation.  Musculoskeletal:  Hammertoes 2 through 5 bilaterally.  Normal muscle strength lower extremity.   Diagnosis: 1. Painful onychomycotic nails 1 through 5 bilaterally. 2. Pain toes 1 through 5 bilaterally. 3.  Tinea pedis bilaterally  Plan: -New patient office visit level 3 for evaluation and management. - Discussed with him the onychomycosis and the tinea pedis.  Discussed fungal etiology of this and treatment options.  Recommended p.o. Lamisil as well as topical ketoconazole cream. -Discussed alternating shoe wear on days so the get a chance to dry out.  Also recommended Noncon socks. -Rx Lamisil 250 mg 1 p.o. daily 30 days - Rx ketoconazole cream apply twice daily to feet, 6 refills -Order labs for LFTs  Return 4 weeks for Lamisil 2

## 2024-06-24 NOTE — Procedures (Signed)
 Orders only

## 2024-07-11 ENCOUNTER — Ambulatory Visit: Payer: MEDICAID | Admitting: Podiatry

## 2024-07-11 ENCOUNTER — Encounter: Payer: Self-pay | Admitting: Podiatry

## 2024-07-11 DIAGNOSIS — M79672 Pain in left foot: Secondary | ICD-10-CM

## 2024-07-11 DIAGNOSIS — M79671 Pain in right foot: Secondary | ICD-10-CM

## 2024-07-11 DIAGNOSIS — B351 Tinea unguium: Secondary | ICD-10-CM | POA: Diagnosis not present

## 2024-07-11 DIAGNOSIS — B353 Tinea pedis: Secondary | ICD-10-CM | POA: Diagnosis not present

## 2024-07-11 MED ORDER — TERBINAFINE HCL 250 MG PO TABS: 250.0000 mg | ORAL_TABLET | Freq: Every day | ORAL | 1 refills | Status: AC

## 2024-07-11 MED ORDER — KETOCONAZOLE 2 % EX CREA
1.0000 | TOPICAL_CREAM | Freq: Two times a day (BID) | CUTANEOUS | 9 refills | Status: AC
Start: 2024-07-11 — End: ?

## 2024-07-11 NOTE — Progress Notes (Signed)
   Subjective:    HPI Presents for follow-up onychomycosis treatment with p.o. Lamisil .  No problems taking medicine with no side effects noted.  Tenderness around toes with walking and wearing shoes.   Objective:  Physical Exam   General: AAO x3, NAD  Vascular: DP and PT pulses palpable bilaterally.  Immedate capillary fill time digits. No significant lower extremity edema bilaterally.  Dermatological: Onychomycotic mycotic changes nails 1 through 5 with discoloration nail and subungual debris and thickening of the nail. 0% Clearance of onychomycotic nail changes noted. Tenderness with walking and wearing shoes.  Neruologic: Grossly intact B/L  Musculoskeletal:   Assessment:  Painful onychomycotic nails 1 through 5 bilaterally. Pain feet b/l Tinea pedis bilaterally     Plan:  -Established office visit level 3 for evaluation and management -Patient is tolerating Lamisil  treatment for onychomycotic nails well.  No side effects noted. Will continue this treatment. -Rx: Lamisil  250 mg p.o. daily ,1 refill -Rx ketoconazole  cream, apply twice daily to affected areas feet, 9 refills - Labs ordered today for liver function tests to monitor for any hepatic side effects from the Lamisil .  - Return in 8 weeks for  Lamisil  final

## 2024-09-10 ENCOUNTER — Ambulatory Visit (INDEPENDENT_AMBULATORY_CARE_PROVIDER_SITE_OTHER): Payer: MEDICAID | Admitting: Podiatry

## 2024-09-10 ENCOUNTER — Encounter: Payer: Self-pay | Admitting: Podiatry

## 2024-09-10 DIAGNOSIS — M79672 Pain in left foot: Secondary | ICD-10-CM | POA: Diagnosis not present

## 2024-09-10 DIAGNOSIS — M79671 Pain in right foot: Secondary | ICD-10-CM | POA: Diagnosis not present

## 2024-09-10 DIAGNOSIS — B351 Tinea unguium: Secondary | ICD-10-CM

## 2024-09-10 NOTE — Progress Notes (Signed)
   Subjective:    HPI Presents for follow-up onychomycosis treatment with p.o. Lamisil .  No problems taking medicine with no side effects noted.  Tenderness around toes with walking and wearing shoes.   Objective:  Physical Exam   General: AAO x3, NAD  Vascular: DP and PT pulses palpable bilaterally.  Immedate capillary fill time digits. No significant lower extremity edema bilaterally.  Dermatological: Onychomycotic mycotic changes nails 1 through 5 with discoloration nail and subungual debris and thickening of the nail. 10% Clearance of onychomycotic nail changes noted. Tenderness with walking and wearing shoes.  Neruologic: Grossly intact B/L  Musculoskeletal:   Assessment:  Painful onychomycotic nails 1 through 5 bilaterally. Pain feet b/l     Plan:  -Established office visit level 3 for evaluation and management -Patient has tolerated Lamisil  treatment well.  Will do a final set of labs.  Seems to be working with clearing of the proximal nail.  Discussed with him that and other 6 months or so should be cleared out or nearly completely cleared.  If not he is to call for an appointment and we can reassess. -Rx: Lamisil  250 mg p.o. daily - Labs ordered today for liver function tests to monitor for any hepatic side effects from the Lamisil .  - PRN

## 2024-09-28 ENCOUNTER — Ambulatory Visit: Payer: MEDICAID

## 2024-09-28 ENCOUNTER — Ambulatory Visit
Admission: EM | Admit: 2024-09-28 | Discharge: 2024-09-28 | Disposition: A | Discharge status: Home / Self Care | Payer: MEDICAID | Attending: Family Medicine | Admitting: Family Medicine

## 2024-09-28 ENCOUNTER — Ambulatory Visit: Payer: Self-pay | Admitting: Urgent Care

## 2024-09-28 DIAGNOSIS — S60012A Contusion of left thumb without damage to nail, initial encounter: Secondary | ICD-10-CM

## 2024-09-28 DIAGNOSIS — S60221A Contusion of right hand, initial encounter: Secondary | ICD-10-CM

## 2024-09-28 DIAGNOSIS — M79642 Pain in left hand: Secondary | ICD-10-CM | POA: Diagnosis not present

## 2024-09-28 DIAGNOSIS — M79645 Pain in left finger(s): Secondary | ICD-10-CM | POA: Diagnosis not present

## 2024-09-28 MED ORDER — IBUPROFEN 800 MG PO TABS: 800.0000 mg | ORAL_TABLET | Freq: Three times a day (TID) | ORAL | 0 refills | Status: DC | PRN

## 2024-09-28 NOTE — Discharge Instructions (Signed)
 Wear the thumb/wrist splint during the day. Use ibuprofen  for pain and inflammation. Follow up with your orthopedist as soon as possible.
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
## 2024-09-29 ENCOUNTER — Ambulatory Visit: Payer: MEDICAID

## 2024-10-15 ENCOUNTER — Other Ambulatory Visit: Payer: Self-pay | Admitting: Orthopedic Surgery

## 2024-10-15 ENCOUNTER — Encounter (HOSPITAL_COMMUNITY): Payer: Self-pay | Admitting: Orthopedic Surgery

## 2024-10-15 ENCOUNTER — Other Ambulatory Visit: Payer: Self-pay

## 2024-10-15 NOTE — Progress Notes (Addendum)
 Date of COVID positive in last 90 days: No  PCP - Nell Piety, NP Cardiologist - N/A  Chest x-ray - N/A EKG - N/A Stress Test - N/A ECHO - N/A Cardiac Cath - N/A Pacemaker/ICD device last checked:N/A Spinal Cord Stimulator:N/A  Bowel Prep - N/A  Sleep Study - Ordered but has not completed CPAP -   Prediabetes Fasting Blood Sugar - N/A Checks Blood Sugar _____ times a day  Last dose of GLP1 agonist-  N/A GLP1 instructions:  Do not take after     Last dose of SGLT-2 inhibitors-  N/A SGLT-2 instructions:  Do not take after    Blood Thinner Instructions: N/A Last dose:   Time: Aspirin  Instructions:N/A Last Dose:  Activity level:  Can go up a flight of stairs and perform activities of daily living without stopping and without symptoms of chest pain or shortness of breath.  Anesthesia review: hx of seizures, no seizure in over 2 years  Patient denies shortness of breath, fever, cough and chest pain at PAT appointment  Patient verbalized understanding of instructions that were given to them at the PAT appointment. Patient was also instructed that they will need to review over the PAT instructions again at home before surgery.

## 2024-10-15 NOTE — Progress Notes (Signed)
 For Anesthesia: PCP - [facility]  Cardiologist -   Bowel Prep reminder:  Chest x-ray - 02/10/24 in care everywhere [facility] EKG -  Stress Test -  ECHO -  Cardiac Cath -  Pacemaker/ICD device last checked: Pacemaker orders received: Device Rep notified:  Spinal Cord Stimulator:  Sleep Study - 03/18/24 in chart CPAP -   Fasting Blood Sugar -  Checks Blood Sugar _____ times a day Date and result of last Hgb A1c-  Last dose of GLP1 agonist-  GLP1 instructions: Hold 7 days prior to schedule (Hold 24 hours-daily)   Last dose of SGLT-2 inhibitors-  SGLT-2 instructions: Hold 72 hours prior to surgery  Blood Thinner Instructions: Last Dose: Time last taken:  Aspirin  Instructions: Last Dose: Time last taken:  Activity level: Can go up a flight of stairs and activities of daily living without stopping and without chest pain and/or shortness of breath   Able to exercise without chest pain and/or shortness of breath   Unable to go up a flight of stairs without chest pain and/or shortness of breath     Anesthesia review:   Patient denies shortness of breath, fever, cough and chest pain at PAT appointment   Patient verbalized understanding of instructions that were reviewed over the telephone.

## 2024-10-15 NOTE — Patient Instructions (Signed)
 SURGICAL WAITING ROOM VISITATION Patients having surgery or a procedure may have no more than 2 support people in the waiting area - these visitors may rotate.    Children under the age of 21 must have an adult with them who is not the patient.  If the patient needs to stay at the hospital during part of their recovery, the visitor guidelines for inpatient rooms apply. Pre-op nurse will coordinate an appropriate time for 1 support person to accompany patient in pre-op.  This support person may not rotate.    Please refer to the [facility] website for the visitor guidelines for Inpatients (after your surgery is over and you are in a regular room).       Your procedure is scheduled on: 10-20-24   Report to [facility] Main Entrance    Report to admitting at 7:45 AM   Call this number if you have problems the morning of surgery [phone]   Do not eat food :After Midnight.   After Midnight you may have the following liquids until 7:00 AM DAY OF SURGERY  Water Non-Citrus Juices (without pulp, NO RED-Apple, White grape, White cranberry) Black Coffee (NO MILK/CREAM OR CREAMERS, sugar ok)  Clear Tea (NO MILK/CREAM OR CREAMERS, sugar ok) regular and decaf                             Plain Jell-O (NO RED)                                           Fruit ices (not with fruit pulp, NO RED)                                     Popsicles (NO RED)                                                               Sports drinks like Gatorade (NO RED)                       If you have questions, please contact your surgeon's office.   FOLLOW  ANY ADDITIONAL PRE OP INSTRUCTIONS YOU RECEIVED FROM YOUR SURGEON'S OFFICE!!!     Oral Hygiene is also important to reduce your risk of infection.                                    Remember - BRUSH YOUR TEETH THE MORNING OF SURGERY WITH YOUR REGULAR TOOTHPASTE   Do NOT smoke after Midnight   Take these medicines the morning of surgery with A SIP  OF WATER:    Tylenol  if needed  Stop all vitamins and herbal supplements 7 days before surgery                              You may not have any metal on your body including jewelry, and body piercing  Do not wear  lotions, powders, cologne, or deodorant              Men may shave face and neck.   Do not bring valuables to the hospital. [facility] IS NOT RESPONSIBLE   FOR VALUABLES.   Contacts, dentures or bridgework may not be worn into surgery.  DO NOT BRING YOUR HOME MEDICATIONS TO THE HOSPITAL. PHARMACY WILL DISPENSE MEDICATIONS LISTED ON YOUR MEDICATION LIST TO YOU DURING YOUR ADMISSION IN THE HOSPITAL!    Patients discharged on the day of surgery will not be allowed to drive home.  Someone NEEDS to stay with you for the first 24 hours after anesthesia.              Please read over the following fact sheets you were given: IF YOU HAVE QUESTIONS ABOUT YOUR PRE-OP INSTRUCTIONS PLEASE CALL [phone] [facility]  If you received a COVID test during your pre-op visit  it is requested that you wear a mask when out in public, stay away from anyone that may not be feeling well and notify your surgeon if you develop symptoms. If you test positive for Covid or have been in contact with anyone that has tested positive in the last 10 days please notify you surgeon.  [facility] - Preparing for Surgery (use an antibacterial soap or whatever soap you have) Before surgery, you can play an important role.  Because skin is not sterile, your skin needs to be as free of germs as possible.  You can reduce the number of germs on your skin by washing with CHG (chlorahexidine gluconate) soap before surgery.  CHG is an antiseptic cleaner which kills germs and bonds with the skin to continue killing germs even after washing. Please DO NOT use if you have an allergy to CHG or antibacterial soaps.  If your skin becomes reddened/irritated stop using the CHG and inform your nurse when you arrive at Short  Stay. Do not shave (including legs and underarms) for at least 48 hours prior to the first CHG shower.  You may shave your face/neck.  Please follow these instructions carefully:  1.  Shower with CHG Soap the night before surgery ONLY (DO NOT USE THE SOAP THE MORNING OF SURGERY).  2.  If you choose to wash your hair, wash your hair first as usual with your normal  shampoo.  3.  After you shampoo, rinse your hair and body thoroughly to remove the shampoo.                             4.  Use CHG as you would any other liquid soap.  You can apply chg directly to the skin and wash.  Gently with a scrungie or clean washcloth.  5.  Apply the CHG Soap to your body ONLY FROM THE NECK DOWN.   Do   not use on face/ open                           Wound or open sores. Avoid contact with eyes, ears mouth and   genitals (private parts).                       Wash face,  Genitals (private parts) with your normal soap.             6.  Wash thoroughly, paying special attention  to the area where your    surgery  will be performed.  7.  Thoroughly rinse your body with warm water from the neck down.  8.  DO NOT shower/wash with your normal soap after using and rinsing off the CHG Soap.                9.  Pat yourself dry with a clean towel.            10.  Wear clean pajamas.            11.  Place clean sheets on your bed the night of your first shower and do not  sleep with pets. Day of Surgery : Do not apply any CHG, lotions/deodorants the morning of surgery.  Please wear clean clothes to the hospital/surgery center.  FAILURE TO FOLLOW THESE INSTRUCTIONS MAY RESULT IN THE CANCELLATION OF YOUR SURGERY  PATIENT SIGNATURE_________________________________  NURSE SIGNATURE__________________________________  ________________________________________________________________________

## 2024-10-15 NOTE — Progress Notes (Signed)
 Sent message, via epic in basket, requesting orders in epic from Careers adviser.

## 2024-10-18 NOTE — Anesthesia Preprocedure Evaluation (Addendum)
 Anesthesia Evaluation  Patient identified by MRN, date of birth, ID band Patient awake    Reviewed: Allergy & Precautions, NPO status , Patient's Chart, lab work & pertinent test results  Airway Mallampati: II  TM Distance: >3 FB Neck ROM: Full    Dental no notable dental hx. (+) Dental Advisory Given, Teeth Intact   Pulmonary Current Smoker and Patient abstained from smoking.   Pulmonary exam normal breath sounds clear to auscultation       Cardiovascular negative cardio ROS Normal cardiovascular exam Rhythm:Regular Rate:Normal     Neuro/Psych  Headaches, Seizures -,  PSYCHIATRIC DISORDERS Anxiety Depression Bipolar Disorder Schizophrenia     GI/Hepatic ,GERD  Controlled,,(+)     substance abuse  alcohol use and marijuana use  Endo/Other  negative endocrine ROS    Renal/GU negative Renal ROS     Musculoskeletal negative musculoskeletal ROS (+)    Abdominal  (+) + obese  Peds  Hematology negative hematology ROS (+)   Anesthesia Other Findings   Reproductive/Obstetrics                              Anesthesia Physical Anesthesia Plan  ASA: 2  Anesthesia Plan: General   Post-op Pain Management: Tylenol  PO (pre-op)*, Toradol  IV (intra-op)*, Gabapentin  PO (pre-op)* and Precedex   Induction: Intravenous  PONV Risk Score and Plan: 0 and Propofol  infusion, TIVA, Treatment may vary due to age or medical condition, Midazolam , Ondansetron  and Dexamethasone   Airway Management Planned: LMA  Additional Equipment:   Intra-op Plan:   Post-operative Plan: Extubation in OR  Informed Consent: I have reviewed the patients History and Physical, chart, labs and discussed the procedure including the risks, benefits and alternatives for the proposed anesthesia with the patient or authorized representative who has indicated his/her understanding and acceptance.     Dental advisory given  Plan  Discussed with: CRNA  Anesthesia Plan Comments: (Risks of anesthesia explained at length. This includes, but is not limited to, sore throat, damage to teeth, lips gums, tongue and vocal cords, nausea and vomiting, reactions to medications, stroke, heart attack, and death. All patient questions were answered and the patient wishes to proceed. Risks of peripheral nerve block explained at length. This includes, but is not limited to, bleeding, infection, reactions to the medications, seizures, damage to surrounding structures, damage to nerves, permanent weakness, numbness, tingling and pain. All patient questions were answered and patient wishes to proceed with general anesthesia and no block. )         Anesthesia Quick Evaluation

## 2024-10-20 ENCOUNTER — Ambulatory Visit (HOSPITAL_COMMUNITY): Payer: MEDICAID | Admitting: Anesthesiology

## 2024-10-20 ENCOUNTER — Ambulatory Visit (HOSPITAL_COMMUNITY)
Admission: RE | Admit: 2024-10-20 | Discharge: 2024-10-20 | Disposition: A | Discharge status: Home / Self Care | Payer: MEDICAID | Attending: Orthopedic Surgery | Admitting: Orthopedic Surgery

## 2024-10-20 ENCOUNTER — Encounter (HOSPITAL_COMMUNITY)
Admission: RE | Disposition: A | Discharge status: Home / Self Care | Payer: Self-pay | Source: Home / Self Care | Attending: Orthopedic Surgery

## 2024-10-20 ENCOUNTER — Other Ambulatory Visit: Payer: Self-pay

## 2024-10-20 ENCOUNTER — Encounter (HOSPITAL_COMMUNITY): Payer: Self-pay | Admitting: Orthopedic Surgery

## 2024-10-20 DIAGNOSIS — Z01818 Encounter for other preprocedural examination: Secondary | ICD-10-CM

## 2024-10-20 DIAGNOSIS — S63641D Sprain of metacarpophalangeal joint of right thumb, subsequent encounter: Secondary | ICD-10-CM | POA: Diagnosis not present

## 2024-10-20 HISTORY — DX: Epilepsy, unspecified, not intractable, without status epilepticus: G40.909

## 2024-10-20 HISTORY — DX: Bipolar disorder, unspecified: F31.9

## 2024-10-20 HISTORY — DX: Anxiety disorder, unspecified: F41.9

## 2024-10-20 HISTORY — DX: Gastro-esophageal reflux disease without esophagitis: K21.9

## 2024-10-20 HISTORY — PX: LIGAMENT REPAIR: SHX5444

## 2024-10-20 HISTORY — DX: Prediabetes: R73.03

## 2024-10-20 HISTORY — DX: Migraine, unspecified, not intractable, without status migrainosus: G43.909

## 2024-10-20 HISTORY — DX: Vitamin D deficiency, unspecified: E55.9

## 2024-10-20 HISTORY — DX: Schizophrenia, unspecified: F20.9

## 2024-10-20 LAB — CBC
HCT: 43.2 % (ref 39.0–52.0)
Hemoglobin: 14.3 g/dL (ref 13.0–17.0)
MCH: 29.9 pg (ref 26.0–34.0)
MCHC: 33.1 g/dL (ref 30.0–36.0)
MCV: 90.4 fL (ref 80.0–100.0)
Platelets: 248 K/uL (ref 150–400)
RBC: 4.78 MIL/uL (ref 4.22–5.81)
RDW: 14.5 % (ref 11.5–15.5)
WBC: 6.8 K/uL (ref 4.0–10.5)
nRBC: 0 % (ref 0.0–0.2)

## 2024-10-20 SURGERY — REPAIR, LIGAMENT
Anesthesia: Regional | Laterality: Right

## 2024-10-20 MED ORDER — LACTATED RINGERS IV SOLN: INTRAVENOUS | Status: DC

## 2024-10-20 MED ORDER — PROPOFOL 10 MG/ML IV BOLUS
INTRAVENOUS | Status: AC
  Filled 2024-10-20: qty 20

## 2024-10-20 MED ORDER — GABAPENTIN 300 MG PO CAPS
300.0000 mg | ORAL_CAPSULE | Freq: Once | ORAL | Status: AC
  Administered 2024-10-20: 300 mg via ORAL
  Filled 2024-10-20: qty 1

## 2024-10-20 MED ORDER — LIDOCAINE HCL (PF) 2 % IJ SOLN
INTRAMUSCULAR | Status: AC
  Filled 2024-10-20: qty 5

## 2024-10-20 MED ORDER — OXYCODONE HCL 5 MG PO TABS: 5.0000 mg | ORAL_TABLET | Freq: Four times a day (QID) | ORAL | 0 refills | Status: AC | PRN

## 2024-10-20 MED ORDER — ORAL CARE MOUTH RINSE: 15.0000 mL | Freq: Once | OROMUCOSAL | Status: AC

## 2024-10-20 MED ORDER — PROPOFOL 500 MG/50ML IV EMUL
INTRAVENOUS | Status: DC | PRN
  Administered 2024-10-20: 130 ug/kg/min via INTRAVENOUS

## 2024-10-20 MED ORDER — FENTANYL CITRATE (PF) 100 MCG/2ML IJ SOLN
INTRAMUSCULAR | Status: AC
  Filled 2024-10-20: qty 2

## 2024-10-20 MED ORDER — MIDAZOLAM HCL (PF) 2 MG/2ML IJ SOLN: 1.0000 mg | INTRAMUSCULAR | Status: DC

## 2024-10-20 MED ORDER — ONDANSETRON HCL 4 MG/2ML IJ SOLN
INTRAMUSCULAR | Status: AC
  Filled 2024-10-20: qty 2

## 2024-10-20 MED ORDER — ONDANSETRON HCL 4 MG/2ML IJ SOLN
INTRAMUSCULAR | Status: DC | PRN
  Administered 2024-10-20: 4 mg via INTRAVENOUS

## 2024-10-20 MED ORDER — FENTANYL CITRATE (PF) 100 MCG/2ML IJ SOLN
INTRAMUSCULAR | Status: DC | PRN
  Administered 2024-10-20 (×2): 25 ug via INTRAVENOUS
  Administered 2024-10-20: 50 ug via INTRAVENOUS

## 2024-10-20 MED ORDER — FENTANYL CITRATE (PF) 50 MCG/ML IJ SOSY
PREFILLED_SYRINGE | INTRAMUSCULAR | Status: AC
  Filled 2024-10-20: qty 2

## 2024-10-20 MED ORDER — CEFAZOLIN SODIUM-DEXTROSE 3-4 GM/150ML-% IV SOLN
3.0000 g | INTRAVENOUS | Status: AC
  Administered 2024-10-20: 3 g via INTRAVENOUS
  Filled 2024-10-20: qty 150

## 2024-10-20 MED ORDER — ACETAMINOPHEN 325 MG PO TABS: 650.0000 mg | ORAL_TABLET | Freq: Four times a day (QID) | ORAL | Status: AC

## 2024-10-20 MED ORDER — PROPOFOL 1000 MG/100ML IV EMUL
INTRAVENOUS | Status: AC
  Filled 2024-10-20: qty 100

## 2024-10-20 MED ORDER — BUPIVACAINE-EPINEPHRINE (PF) 0.5% -1:200000 IJ SOLN
INTRAMUSCULAR | Status: AC
  Filled 2024-10-20: qty 30

## 2024-10-20 MED ORDER — PROPOFOL 10 MG/ML IV BOLUS
INTRAVENOUS | Status: DC | PRN
  Administered 2024-10-20: 300 mg via INTRAVENOUS

## 2024-10-20 MED ORDER — DEXAMETHASONE SOD PHOSPHATE PF 10 MG/ML IJ SOLN
INTRAMUSCULAR | Status: DC | PRN
  Administered 2024-10-20: 10 mg via INTRAVENOUS

## 2024-10-20 MED ORDER — FENTANYL CITRATE (PF) 50 MCG/ML IJ SOSY: 50.0000 ug | PREFILLED_SYRINGE | INTRAMUSCULAR | Status: DC

## 2024-10-20 MED ORDER — LIDOCAINE HCL (PF) 1 % IJ SOLN
INTRAMUSCULAR | Status: AC
  Filled 2024-10-20: qty 30

## 2024-10-20 MED ORDER — CHLORHEXIDINE GLUCONATE 0.12 % MT SOLN
15.0000 mL | Freq: Once | OROMUCOSAL | Status: AC
  Administered 2024-10-20: 15 mL via OROMUCOSAL

## 2024-10-20 MED ORDER — FENTANYL CITRATE (PF) 50 MCG/ML IJ SOSY
25.0000 ug | PREFILLED_SYRINGE | INTRAMUSCULAR | Status: DC | PRN
  Administered 2024-10-20 (×2): 50 ug via INTRAVENOUS

## 2024-10-20 MED ORDER — MEPERIDINE HCL 25 MG/ML IJ SOLN: 6.2500 mg | INTRAMUSCULAR | Status: DC | PRN

## 2024-10-20 MED ORDER — DROPERIDOL 2.5 MG/ML IJ SOLN: 0.6250 mg | Freq: Once | INTRAMUSCULAR | Status: DC | PRN

## 2024-10-20 MED ORDER — 0.9 % SODIUM CHLORIDE (POUR BTL) OPTIME
TOPICAL | Status: DC | PRN
  Administered 2024-10-20: 1000 mL

## 2024-10-20 MED ORDER — ACETAMINOPHEN 500 MG PO TABS
1000.0000 mg | ORAL_TABLET | Freq: Once | ORAL | Status: AC
  Administered 2024-10-20: 1000 mg via ORAL
  Filled 2024-10-20: qty 2

## 2024-10-20 MED ORDER — IBUPROFEN 200 MG PO TABS: 600.0000 mg | ORAL_TABLET | Freq: Four times a day (QID) | ORAL | Status: AC

## 2024-10-20 MED ORDER — MIDAZOLAM HCL 2 MG/2ML IJ SOLN
INTRAMUSCULAR | Status: AC
  Filled 2024-10-20: qty 2

## 2024-10-20 MED ORDER — MIDAZOLAM HCL 5 MG/5ML IJ SOLN
INTRAMUSCULAR | Status: DC | PRN
  Administered 2024-10-20: 2 mg via INTRAVENOUS

## 2024-10-20 MED ORDER — LIDOCAINE HCL (PF) 2 % IJ SOLN
INTRAMUSCULAR | Status: DC | PRN
  Administered 2024-10-20: 100 mg via INTRADERMAL

## 2024-10-20 SURGICAL SUPPLY — 67 items
ANCHOR REPAIR HAND WRIST (Anchor) IMPLANT
BLADE MINI RND TIP GREEN BEAV (BLADE) ×1 IMPLANT
BLADE OSC/SAG .038X5.5 CUT EDG (BLADE) ×1 IMPLANT
BLADE SURG 15 STRL LF DISP TIS (BLADE) ×1 IMPLANT
BNDG COHESIVE 1X5 TAN STRL LF (GAUZE/BANDAGES/DRESSINGS) IMPLANT
BNDG COHESIVE 2X5 TAN ST LF (GAUZE/BANDAGES/DRESSINGS) IMPLANT
BNDG COHESIVE 4X5 TAN STRL LF (GAUZE/BANDAGES/DRESSINGS) IMPLANT
BNDG COMPR ESMARK 4X3 LF (GAUZE/BANDAGES/DRESSINGS) ×1 IMPLANT
BNDG GAUZE DERMACEA FLUFF 4 (GAUZE/BANDAGES/DRESSINGS) ×2 IMPLANT
BNDG STRETCH GAUZE 3IN X12FT (GAUZE/BANDAGES/DRESSINGS) IMPLANT
CHLORAPREP W/TINT 26 (MISCELLANEOUS) ×1 IMPLANT
CLOTH BEACON ORANGE TIMEOUT ST (SAFETY) ×1 IMPLANT
CORD BIPOLAR FORCEPS 12FT (ELECTRODE) IMPLANT
COVER BACK TABLE 60X90IN (DRAPES) ×1 IMPLANT
COVER MAYO STAND STRL (DRAPES) IMPLANT
CUFF TOURN SGL QUICK 18 (TOURNIQUET CUFF) IMPLANT
DRAPE EXTREMITY T 121X128X90 (DISPOSABLE) ×1 IMPLANT
DRAPE SHEET LG 3/4 BI-LAMINATE (DRAPES) ×1 IMPLANT
DRAPE U-SHAPE 47X51 STRL (DRAPES) ×1 IMPLANT
DRSG EMULSION OIL 3X3 NADH (GAUZE/BANDAGES/DRESSINGS) IMPLANT
ELECT NDL BLADE 2-5/6 (NEEDLE) IMPLANT
ELECT REM PT RETURN 15FT ADLT (MISCELLANEOUS) IMPLANT
GAUZE 4X4 16PLY ~~LOC~~+RFID DBL (SPONGE) ×1 IMPLANT
GAUZE SPONGE 4X4 12PLY STRL (GAUZE/BANDAGES/DRESSINGS) ×1 IMPLANT
GAUZE STRETCH 2X75IN STRL (MISCELLANEOUS) IMPLANT
GLOVE BIO SURGEON STRL SZ 6.5 (GLOVE) IMPLANT
GLOVE BIO SURGEON STRL SZ7.5 (GLOVE) ×1 IMPLANT
GLOVE BIOGEL PI IND STRL 7.0 (GLOVE) IMPLANT
GLOVE BIOGEL PI IND STRL 8 (GLOVE) ×2 IMPLANT
GOWN STRL REUS W/ TWL LRG LVL3 (GOWN DISPOSABLE) IMPLANT
GOWN STRL REUS W/TWL LRG LVL3 (GOWN DISPOSABLE) ×2 IMPLANT
KIT BASIN OR (CUSTOM PROCEDURE TRAY) IMPLANT
KIT TURNOVER CYSTO (KITS) ×1 IMPLANT
KIT TURNOVER KIT A (KITS) IMPLANT
KWIRE DBL .062X4 NSTRL (WIRE) ×1 IMPLANT
KWIRE DBL TROCAR .045X4 (WIRE) IMPLANT
LOOP VESSEL MAXI BLUE (MISCELLANEOUS) IMPLANT
LOOP VESSEL MINI RED (MISCELLANEOUS) IMPLANT
MANIFOLD NEPTUNE II (INSTRUMENTS) IMPLANT
NDL HYPO 22X1.5 SAFETY MO (MISCELLANEOUS) IMPLANT
NS IRRIG 500ML POUR BTL (IV SOLUTION) IMPLANT
PACK BASIN DAY SURGERY FS (CUSTOM PROCEDURE TRAY) ×1 IMPLANT
PAD CAST 3X4 CTTN HI CHSV (CAST SUPPLIES) IMPLANT
PAD CAST 4YDX4 CTTN HI CHSV (CAST SUPPLIES) ×1 IMPLANT
PADDING CAST ABS COTTON 3X4 (CAST SUPPLIES) IMPLANT
PADDING CAST ABS COTTON 4X4 ST (CAST SUPPLIES) ×1 IMPLANT
SLEEVE SCD COMPRESS KNEE MED (STOCKING) ×1 IMPLANT
SLEEVE SURGEON STRL (DRAPES) IMPLANT
SLING ARM FOAM STRAP LRG (SOFTGOODS) IMPLANT
SLING ARM FOAM STRAP MED (SOFTGOODS) IMPLANT
SPLINT PLASTER CAST XFAST 4X15 (CAST SUPPLIES) IMPLANT
SPLINT PLASTER GYPS XFAST 3X15 (CAST SUPPLIES) IMPLANT
STOCKINETTE 6 STRL (DRAPES) ×1 IMPLANT
SUCTION TUBE FRAZIER 12FR DISP (SUCTIONS) IMPLANT
SUT ETHIBOND 3-0 V-5 (SUTURE) ×1 IMPLANT
SUT MERSILENE 4 0 P 3 (SUTURE) IMPLANT
SUT PDS AB 2-0 CT2 27 (SUTURE) IMPLANT
SUT STEEL 4 (SUTURE) ×1 IMPLANT
SUT VIC AB 2-0 PS2 27 (SUTURE) IMPLANT
SUT VIC AB 3-0 SH 27X BRD (SUTURE) IMPLANT
SUT VIC AB 4-0 PS2 18 (SUTURE) IMPLANT
SUT VICRYL RAPIDE 4/0 PS 2 (SUTURE) ×1 IMPLANT
SYR 10ML LL (SYRINGE) ×1 IMPLANT
TUBE CONNECTING 12X1/4 (SUCTIONS) IMPLANT
TUBING CONNECTING 10 (TUBING) IMPLANT
UNDERPAD 30X36 HEAVY ABSORB (UNDERPADS AND DIAPERS) ×1 IMPLANT
WATER STERILE IRR 500ML POUR (IV SOLUTION) ×1 IMPLANT

## 2024-10-20 NOTE — Discharge Instructions (Addendum)
 Discharge Instructions   You have a dressing with a plaster splint incorporated in it. Move your fingers as much as possible, making a full fist and fully opening the fist. Elevate your hand to reduce pain & swelling of the digits.  Ice over the operative site may be helpful to reduce pain & swelling.  DO NOT USE HEAT. Take Tylenol  650 mg and Ibuprofen  600 mg every 6 hours together for pain management. Take the prescribed medicine additionally for severe post operative pain.  Leave the dressing in place until you return to our office.  You may shower, but keep the bandage clean & dry.  You may drive a car when you are off of prescription pain medications and can safely control your vehicle with both hands. Our office will call you to arrange a follow up appointment. No work or use of the right hand. No strong gripping or grasping.   Please call [phone] during normal business hours or [phone] after hours for any problems. Including the following:  - excessive redness of the incisions - drainage for more than 4 days - fever of more than 101.5 F  *Please note that pain medications will not be refilled after hours or on weekends.

## 2024-10-20 NOTE — Transfer of Care (Signed)
 Immediate Anesthesia Transfer of Care Note  Patient: Garrett Carrillo  Procedure(s) Performed: REPAIR, LIGAMENT (Right)  Patient Location: PACU  Anesthesia Type:General  Level of Consciousness: sedated  Airway & Oxygen Therapy: Patient Spontanous Breathing and Patient connected to face mask oxygen  Post-op Assessment: Report given to RN and Post -op Vital signs reviewed and stable  Post vital signs: Reviewed and stable  Last Vitals:  Vitals Value Taken Time  BP 126/67 10/20/24 11:19  Temp    Pulse 80 10/20/24 11:21  Resp 29 10/20/24 11:21  SpO2 100 % 10/20/24 11:21  Vitals shown include unfiled device data.  Last Pain:  Vitals:   10/20/24 0802  TempSrc: Oral  PainSc: 7          Complications: No notable events documented.

## 2024-10-20 NOTE — Anesthesia Postprocedure Evaluation (Signed)
 Anesthesia Post Note  Patient: Garrett Carrillo  Procedure(s) Performed: REPAIR, LIGAMENT (Right)     Patient location during evaluation: PACU Anesthesia Type: General Level of consciousness: sedated and patient cooperative Pain management: pain level controlled Vital Signs Assessment: post-procedure vital signs reviewed and stable Respiratory status: spontaneous breathing Cardiovascular status: stable Anesthetic complications: no   No notable events documented.  Last Vitals:  Vitals:   10/20/24 1230 10/20/24 1245  BP: (!) 140/89   Pulse: 88   Resp: 11 12  Temp:  36.8 C  SpO2: 100% 100%    Last Pain:  Vitals:   10/20/24 1245  TempSrc:   PainSc: 3                  Norleen Pope

## 2024-10-20 NOTE — Anesthesia Procedure Notes (Signed)
 Procedure Name: LMA Insertion Date/Time: 10/20/2024 10:19 AM  Performed by: Carleton Garnette SAUNDERS, CRNAPre-anesthesia Checklist: Patient identified, Emergency Drugs available, Suction available, Patient being monitored and Timeout performed Patient Re-evaluated:Patient Re-evaluated prior to induction Oxygen Delivery Method: Circle system utilized Preoxygenation: Pre-oxygenation with 100% oxygen Induction Type: IV induction LMA: LMA inserted LMA Size: 5.0 Tube type: Oral Number of attempts: 1 Placement Confirmation: positive ETCO2 and breath sounds checked- equal and bilateral Tube secured with: Tape Dental Injury: Teeth and Oropharynx as per pre-operative assessment

## 2024-10-20 NOTE — Op Note (Signed)
 10/20/2024  10:10 AM  PATIENT:  Garrett Carrillo  33 y.o. male  PRE-OPERATIVE DIAGNOSIS: Right thumb MPJ UCL tear  POST-OPERATIVE DIAGNOSIS:  Same  PROCEDURE: Right thumb MPJ UCL primary repair  SURGEON: Alm LABOR. Sebastian, MD  PHYSICIAN ASSISTANT: Abigail Kuster, OPA-C  ANESTHESIA: General  SPECIMENS:  None  DRAINS:   None  EBL: Less than 10 mL  PREOPERATIVE INDICATIONS:  Garrett Carrillo is a  33 y.o. male with a right thumb MP UCL tear, confirmed via MRI scan.  The risks benefits and alternatives were discussed with the patient preoperatively including but not limited to the risks of infection, bleeding, nerve injury, cardiopulmonary complications, the need for revision surgery, among others, and the patient verbalized understanding and consented to proceed.  OPERATIVE IMPLANTS: Arthrex 3.5 mm swivel lock anchors x 2 with internal brace  OPERATIVE PROCEDURE:  After receiving prophylactic antibiotics, the patient was escorted to the operative theatre and placed in a supine position.  General anesthesia was administered, as it was reported to me that he had refused to undergo regional block/MAC.SABRA  A surgical "time-out" was performed during which the planned procedure, proposed operative site, and the correct patient identity were compared to the operative consent and agreement confirmed by the circulating nurse according to current facility policy.  Following application of a tourniquet to the operative extremity, the exposed skin was prepped with Chloraprep and draped in the usual sterile fashion.  The limb was exsanguinated with an Esmarch bandage and the tourniquet inflated to approximately higher than systolic BP.  A sinusoidal S shaped incision was fashioned over the ulnar side of the thumb MP joint.  Full-thickness flaps were elevated with care to protect the cutaneous nerves crossing by elevating them in the flap.  The extensor retinacular layer was divided and reflected  palmarly and dorsally to reveal the underlying collateral ligament structures.  A dorsal ulnar longitudinal arthrotomy was performed.  The cartilage was in good condition.  This allowed for exposure of the base of the proximal phalanx, from which the ligament had been torn.  There was a lot of healing near tendinous tissue without firm anchorage in the base of the proximal phalanx.  The base of the phalanx was prepared by scraping it with the edges of the knife and the 2-0 FiberWire placed as a Krakw stitch in the ligament tissue which was robust and long enough to span the gap.  A K wire was placed in the metacarpal for the second anchor placement, as well as the K wire for placement of the primary tunnel in the proximal phalanx.  This tunnel was created.  The joint was held slightly flexed and in neutral radial/ulnar alignment when the stitch securing the ligament structures was tensioned and placed within the depths of the tunnel and secured with a 3.5 mm swivel lock anchor.  Also secured was the internal brace.  The proximal anchor in the metacarpal was then made and the internal brace tensioned across the ligament, superficial to it, and it was appropriately tensioned and secured with another swivel lock anchor.  There was good flexion and extension of the joint, excellent collateral stability in both directions, and the wound was irrigated.  The capsulotomy was repaired with 3-0 Vicryl suture in a running and interrupted fashion.  The retinacular layer was also repaired in the same fashion and the tourniquet was released.  Additional hemostasis was unnecessary and the skin was reapproximated with 4-0 Vicryl Rapide running horizontal mattress sutures.  A forearm-based thumb spica splint dressing was applied and he was awakened and taken to the recovery room in stable condition.  DISPOSITION: He will be discharged home today with typical instructions returning sometime before December 15 to be placed into a  short arm thumb spica cast at that time, for likely another 2 to 4 weeks before beginning motion in a removable splint, as he has revealed to me there is a possibility he could be incarcerated after the 15th, in which case we will opt more for protection than movement.

## 2024-10-20 NOTE — H&P (Signed)
 ORTHOPAEDIC CONSULTATION HISTORY & PHYSICAL REQUESTING PHYSICIAN: Sebastian Lenis, MD  Chief Complaint: right thumb pain  HPI: Garrett Carrillo is a 33 y.o. male who presented with painful right thumb MP instability, MRI consistent with distal UCL tear, with what appears to be sufficient tissue for local repair.  Past Medical History:  Diagnosis Date   ADHD (attention deficit hyperactivity disorder)    Anxiety    Bipolar disorder (HCC)    Cannabis abuse    Depression    GERD (gastroesophageal reflux disease)    Migraine    Pre-diabetes    Schizophrenia (HCC)    Seizure disorder (HCC)    Suicide attempt (HCC)    Vitamin D  deficiency    Past Surgical History:  Procedure Laterality Date   eardrum repair Right    thumb surgery Right 11/21/2007   WISDOM TOOTH EXTRACTION     Social History   Socioeconomic History   Marital status: Married    Spouse name: Not on file   Number of children: 5   Years of education: Not on file   Highest education level: High school graduate  Occupational History   Not on file  Tobacco Use   Smoking status: Every Day    Current packs/day: 1.00    Average packs/day: 1 pack/day for 10.0 years (10.0 ttl pk-yrs)    Types: Cigarettes   Smokeless tobacco: Never  Vaping Use   Vaping status: Never Used  Substance and Sexual Activity   Alcohol use: Yes    Comment: occasional   Drug use: Yes    Types: Marijuana    Comment: occ   Sexual activity: Yes    Birth control/protection: None  Other Topics Concern   Not on file  Social History Narrative   Lives with his foster parents    Right handed   Social Drivers of Health   Financial Resource Strain: Not on file  Food Insecurity: Not on file  Transportation Needs: Not on file  Physical Activity: Not on file  Stress: Not on file  Social Connections: Not on file   Family History  Adopted: Yes  Problem Relation Age of Onset   Hypertension Mother    Hypertension Maternal Grandmother     Hypertension Maternal Grandfather    Hypertension Paternal Grandmother    Hypertension Paternal Grandfather    Seizures Neg Hx    Allergies  Allergen Reactions   Penicillins Anaphylaxis and Other (See Comments)   Sulfa Antibiotics Other (See Comments)   Prior to Admission medications   Medication Sig Start Date End Date Taking? Authorizing Provider  ibuprofen  (ADVIL ) 800 MG tablet Take 1 tablet (800 mg total) by mouth every 8 (eight) hours as needed. 09/28/24  Yes Christopher Savannah, PA-C  ketoconazole  (NIZORAL ) 2 % cream Apply 1 Application topically 2 (two) times daily. 07/11/24  Yes Petery, John, DPM  QUEtiapine (SEROQUEL) 50 MG tablet Take 50 mg by mouth at bedtime.   Yes [provider]  VRAYLAR 3 MG capsule Take 3 mg by mouth daily. 04/25/22  Yes [provider]  acetaminophen  (TYLENOL ) 500 MG tablet Take 2 tablets (1,000 mg total) by mouth every 6 (six) hours as needed. 04/26/24   Enedelia Dorna HERO, FNP  doxycycline  (VIBRAMYCIN ) 100 MG capsule Take 1 capsule (100 mg total) by mouth 2 (two) times daily. Patient not taking: Reported on 10/15/2024 04/26/24   Enedelia Dorna HERO, FNP  methylPREDNISolone  (MEDROL  DOSEPAK) 4 MG TBPK tablet Take by mouth daily. Follow package instructions Patient  not taking: Reported on 10/15/2024 07/03/23   Van Knee, MD  mupirocin  cream (BACTROBAN ) 2 % Apply 1 Application topically 2 (two) times daily. Patient not taking: Reported on 10/15/2024 08/10/23   Geroldine Berg, MD  naproxen  (NAPROSYN ) 500 MG tablet Take 1 tablet (500 mg total) by mouth 2 (two) times daily. Patient not taking: Reported on 10/15/2024 07/03/23   Mortenson, Ashley, MD  Risperidone  0.25 MG TBDP     [provider]  terbinafine  (LAMISIL ) 250 MG tablet Take 1 tablet (250 mg total) by mouth daily. Patient not taking: Reported on 10/15/2024 07/11/24   Christine Rush, DPM   No results found.  Positive ROS: All other systems have been reviewed and were otherwise  negative with the exception of those mentioned in the HPI and as above.  Physical Exam: Vitals: Refer to EMR. Constitutional:  WD, WN, NAD HEENT:  NCAT, EOMI Neuro/Psych:  Alert & oriented to person, place, and time; appropriate mood & affect Lymphatic: No generalized extremity edema or lymphadenopathy Extremities / MSK:  The extremities are normal with respect to appearance, ranges of motion, joint stability, muscle strength/tone, sensation, & perfusion except as otherwise noted:  Right thumb with significant UCL instability at the MP joint, not unstable to hyperextension stress nor ulnarly directed stress.  Palmaris longus appears present by exam.  Assessment: Right thumb UCL instability with pain  Plan: Discussion was held regarding the indications for operative treatment, hopefully with direct repair of local tissue, but with reconstruction with palmaris longus if need be.  Goals, risk, and options were reviewed and consent obtained.  Alm FELIX Sebastian, MD      Orthopaedic & Hand Surgery [facility] [address] [place], [place]  [address] Office: [phone]  10/20/2024, 10:07 AM

## 2024-10-20 NOTE — Interval H&P Note (Signed)
 History and Physical Interval Note:  10/20/2024 10:10 AM  Garrett Carrillo  has presented today for surgery, with the diagnosis of RIGHT THUMP METACARPOPHALANGEAL JOINT ULNAR COLLATERAL LIGAMENT TEAR.  The various methods of treatment have been discussed with the patient and family. After consideration of risks, benefits and other options for treatment, the patient has consented to  Procedure(s): REPAIR, LIGAMENT (Right) as a surgical intervention.  The patient's history has been reviewed, patient examined, no change in status, stable for surgery.  I have reviewed the patient's chart and labs.  Questions were answered to the patient's satisfaction.     [name]

## 2024-10-21 ENCOUNTER — Encounter (HOSPITAL_COMMUNITY): Payer: Self-pay | Admitting: Orthopedic Surgery

## 2024-10-24 ENCOUNTER — Encounter (HOSPITAL_COMMUNITY): Payer: Self-pay | Admitting: Orthopedic Surgery

## 2024-10-28 ENCOUNTER — Encounter (HOSPITAL_COMMUNITY): Payer: Self-pay | Admitting: Orthopedic Surgery
